# Patient Record
Sex: Female | Born: 1935 | Race: White | Hispanic: No | Smoking: Former smoker
Health system: Southern US, Community
[De-identification: ages and names within clinical notes are randomized; demographics above are authoritative.]

## PROBLEM LIST (undated history)

## (undated) DIAGNOSIS — I10 Essential (primary) hypertension: Secondary | ICD-10-CM

## (undated) DIAGNOSIS — I4891 Unspecified atrial fibrillation: Secondary | ICD-10-CM

## (undated) DIAGNOSIS — I509 Heart failure, unspecified: Secondary | ICD-10-CM

## (undated) DIAGNOSIS — E785 Hyperlipidemia, unspecified: Secondary | ICD-10-CM

## (undated) HISTORY — PX: TONSILLECTOMY: SUR1361

---

## 1999-08-14 ENCOUNTER — Encounter: Payer: Self-pay | Admitting: Emergency Medicine

## 1999-08-14 ENCOUNTER — Emergency Department (HOSPITAL_COMMUNITY): Admission: EM | Admit: 1999-08-14 | Discharge: 1999-08-14 | Payer: Self-pay | Admitting: Emergency Medicine

## 1999-12-03 ENCOUNTER — Encounter: Payer: Self-pay | Admitting: Internal Medicine

## 1999-12-03 ENCOUNTER — Encounter: Admission: RE | Admit: 1999-12-03 | Discharge: 1999-12-03 | Payer: Self-pay | Admitting: Internal Medicine

## 2016-06-13 ENCOUNTER — Emergency Department: Payer: Medicare Other

## 2016-06-13 ENCOUNTER — Encounter: Payer: Self-pay | Admitting: Emergency Medicine

## 2016-06-13 ENCOUNTER — Observation Stay
Admission: EM | Admit: 2016-06-13 | Discharge: 2016-06-13 | Disposition: A | Discharge status: Home / Self Care | Payer: Medicare Other | Attending: Internal Medicine | Admitting: Internal Medicine

## 2016-06-13 ENCOUNTER — Observation Stay
Admit: 2016-06-13 | Discharge: 2016-06-13 | Disposition: A | Discharge status: Home / Self Care | Payer: Medicare Other | Attending: Internal Medicine | Admitting: Internal Medicine

## 2016-06-13 DIAGNOSIS — R2243 Localized swelling, mass and lump, lower limb, bilateral: Secondary | ICD-10-CM | POA: Diagnosis not present

## 2016-06-13 DIAGNOSIS — I482 Chronic atrial fibrillation: Secondary | ICD-10-CM | POA: Diagnosis not present

## 2016-06-13 DIAGNOSIS — I2510 Atherosclerotic heart disease of native coronary artery without angina pectoris: Secondary | ICD-10-CM | POA: Diagnosis not present

## 2016-06-13 DIAGNOSIS — E785 Hyperlipidemia, unspecified: Secondary | ICD-10-CM | POA: Insufficient documentation

## 2016-06-13 DIAGNOSIS — Z87891 Personal history of nicotine dependence: Secondary | ICD-10-CM | POA: Insufficient documentation

## 2016-06-13 DIAGNOSIS — I509 Heart failure, unspecified: Secondary | ICD-10-CM

## 2016-06-13 DIAGNOSIS — R0902 Hypoxemia: Secondary | ICD-10-CM

## 2016-06-13 DIAGNOSIS — Z79899 Other long term (current) drug therapy: Secondary | ICD-10-CM | POA: Insufficient documentation

## 2016-06-13 DIAGNOSIS — I110 Hypertensive heart disease with heart failure: Principal | ICD-10-CM | POA: Insufficient documentation

## 2016-06-13 DIAGNOSIS — R0602 Shortness of breath: Secondary | ICD-10-CM | POA: Diagnosis present

## 2016-06-13 DIAGNOSIS — I5033 Acute on chronic diastolic (congestive) heart failure: Secondary | ICD-10-CM | POA: Diagnosis not present

## 2016-06-13 DIAGNOSIS — I083 Combined rheumatic disorders of mitral, aortic and tricuspid valves: Secondary | ICD-10-CM | POA: Diagnosis not present

## 2016-06-13 DIAGNOSIS — Z7901 Long term (current) use of anticoagulants: Secondary | ICD-10-CM | POA: Insufficient documentation

## 2016-06-13 DIAGNOSIS — R0603 Acute respiratory distress: Secondary | ICD-10-CM

## 2016-06-13 HISTORY — DX: Unspecified atrial fibrillation: I48.91

## 2016-06-13 HISTORY — DX: Heart failure, unspecified: I50.9

## 2016-06-13 HISTORY — DX: Hyperlipidemia, unspecified: E78.5

## 2016-06-13 HISTORY — DX: Essential (primary) hypertension: I10

## 2016-06-13 LAB — COMPREHENSIVE METABOLIC PANEL
ALT: 15 U/L (ref 14–54)
AST: 24 U/L (ref 15–41)
Albumin: 3.3 g/dL — ABNORMAL LOW (ref 3.5–5.0)
Alkaline Phosphatase: 43 U/L (ref 38–126)
Anion gap: 7 (ref 5–15)
BUN: 23 mg/dL — ABNORMAL HIGH (ref 6–20)
CO2: 28 mmol/L (ref 22–32)
Calcium: 10.4 mg/dL — ABNORMAL HIGH (ref 8.9–10.3)
Chloride: 105 mmol/L (ref 101–111)
Creatinine, Ser: 1.17 mg/dL — ABNORMAL HIGH (ref 0.44–1.00)
GFR calc Af Amer: 49 mL/min — ABNORMAL LOW (ref >60)
GFR calc non Af Amer: 42 mL/min — ABNORMAL LOW (ref >60)
Glucose, Bld: 109 mg/dL — ABNORMAL HIGH (ref 65–99)
Potassium: 3.8 mmol/L (ref 3.5–5.1)
Sodium: 140 mmol/L (ref 135–145)
Total Bilirubin: 0.5 mg/dL (ref 0.3–1.2)
Total Protein: 6.5 g/dL (ref 6.5–8.1)

## 2016-06-13 LAB — CBC WITH DIFFERENTIAL/PLATELET
Basophils Absolute: 0.1 10*3/uL (ref 0–0.1)
Basophils Relative: 1 %
Eosinophils Absolute: 0.3 10*3/uL (ref 0–0.7)
Eosinophils Relative: 4 %
HCT: 28.3 % — ABNORMAL LOW (ref 35.0–47.0)
Hemoglobin: 9 g/dL — ABNORMAL LOW (ref 12.0–16.0)
Lymphocytes Relative: 18 %
Lymphs Abs: 1.3 10*3/uL (ref 1.0–3.6)
MCH: 25.3 pg — ABNORMAL LOW (ref 26.0–34.0)
MCHC: 31.6 g/dL — ABNORMAL LOW (ref 32.0–36.0)
MCV: 80.2 fL (ref 80.0–100.0)
Monocytes Absolute: 0.7 10*3/uL (ref 0.2–0.9)
Monocytes Relative: 9 %
Neutro Abs: 5.1 10*3/uL (ref 1.4–6.5)
Neutrophils Relative %: 68 %
Platelets: 292 10*3/uL (ref 150–440)
RBC: 3.54 MIL/uL — ABNORMAL LOW (ref 3.80–5.20)
RDW: 17.2 % — ABNORMAL HIGH (ref 11.5–14.5)
WBC: 7.4 10*3/uL (ref 3.6–11.0)

## 2016-06-13 LAB — ECHOCARDIOGRAM COMPLETE
Height: 66 in
Weight: 2249.6 oz

## 2016-06-13 LAB — MRSA PCR SCREENING: MRSA by PCR: NEGATIVE

## 2016-06-13 LAB — TROPONIN I
Troponin I: <0.03 ng/mL (ref <0.03)
Troponin I: 0.04 ng/mL (ref <0.03)
Troponin I: 0.04 ng/mL (ref <0.03)

## 2016-06-13 LAB — BRAIN NATRIURETIC PEPTIDE: B Natriuretic Peptide: 485 pg/mL — ABNORMAL HIGH (ref 0.0–100.0)

## 2016-06-13 LAB — TSH: TSH: 3.231 u[IU]/mL (ref 0.350–4.500)

## 2016-06-13 MED ORDER — VITAMIN B-12 1000 MCG PO TABS
1000.0000 ug | ORAL_TABLET | Freq: Every day | ORAL | Status: DC
  Filled 2016-06-13: qty 1

## 2016-06-13 MED ORDER — COD LIVER OIL 1000 MG PO CAPS: 2000.0000 mg | ORAL_CAPSULE | Freq: Every day | ORAL | Status: DC

## 2016-06-13 MED ORDER — FUROSEMIDE 20 MG PO TABS: 20.0000 mg | ORAL_TABLET | ORAL | Status: DC

## 2016-06-13 MED ORDER — DOCUSATE SODIUM 100 MG PO CAPS
100.0000 mg | ORAL_CAPSULE | Freq: Two times a day (BID) | ORAL | Status: DC
  Filled 2016-06-13: qty 1

## 2016-06-13 MED ORDER — POTASSIUM CHLORIDE CRYS ER 20 MEQ PO TBCR
20.0000 meq | EXTENDED_RELEASE_TABLET | Freq: Two times a day (BID) | ORAL | Status: DC
  Filled 2016-06-13: qty 1

## 2016-06-13 MED ORDER — FUROSEMIDE 10 MG/ML IJ SOLN: 20.0000 mg | INTRAMUSCULAR | Status: DC

## 2016-06-13 MED ORDER — OMEGA-3-ACID ETHYL ESTERS 1 G PO CAPS
2.0000 g | ORAL_CAPSULE | Freq: Every day | ORAL | Status: DC
  Filled 2016-06-13: qty 2

## 2016-06-13 MED ORDER — CARVEDILOL 3.125 MG PO TABS
3.1250 mg | ORAL_TABLET | Freq: Two times a day (BID) | ORAL | Status: DC
  Filled 2016-06-13: qty 1

## 2016-06-13 MED ORDER — AMIODARONE HCL 200 MG PO TABS
200.0000 mg | ORAL_TABLET | Freq: Every day | ORAL | Status: DC
  Filled 2016-06-13: qty 1

## 2016-06-13 MED ORDER — VITAMIN D 1000 UNITS PO TABS
1000.0000 [IU] | ORAL_TABLET | Freq: Every day | ORAL | Status: DC
  Filled 2016-06-13: qty 1

## 2016-06-13 MED ORDER — FUROSEMIDE 10 MG/ML IJ SOLN
40.0000 mg | Freq: Once | INTRAMUSCULAR | Status: AC
  Administered 2016-06-13: 40 mg via INTRAVENOUS
  Filled 2016-06-13: qty 4

## 2016-06-13 MED ORDER — ACETAMINOPHEN 325 MG PO TABS: 650.0000 mg | ORAL_TABLET | Freq: Four times a day (QID) | ORAL | Status: DC | PRN

## 2016-06-13 MED ORDER — CO Q-10 200 MG PO CAPS: 1.0000 | ORAL_CAPSULE | Freq: Every day | ORAL | Status: DC

## 2016-06-13 MED ORDER — ONDANSETRON HCL 4 MG PO TABS: 4.0000 mg | ORAL_TABLET | Freq: Four times a day (QID) | ORAL | Status: DC | PRN

## 2016-06-13 MED ORDER — SODIUM CHLORIDE 0.9% FLUSH: 3.0000 mL | Freq: Two times a day (BID) | INTRAVENOUS | Status: DC

## 2016-06-13 MED ORDER — HYDROCHLOROTHIAZIDE 25 MG PO TABS
25.0000 mg | ORAL_TABLET | Freq: Every day | ORAL | Status: DC
  Filled 2016-06-13: qty 1

## 2016-06-13 MED ORDER — FUROSEMIDE 20 MG PO TABS: 20.0000 mg | ORAL_TABLET | ORAL | 0 refills | Status: AC

## 2016-06-13 MED ORDER — FUROSEMIDE 10 MG/ML IJ SOLN
20.0000 mg | Freq: Once | INTRAMUSCULAR | Status: AC
  Administered 2016-06-13: 20 mg via INTRAVENOUS
  Filled 2016-06-13: qty 4

## 2016-06-13 MED ORDER — APIXABAN 2.5 MG PO TABS
2.5000 mg | ORAL_TABLET | Freq: Two times a day (BID) | ORAL | Status: DC
  Filled 2016-06-13: qty 1

## 2016-06-13 MED ORDER — ONDANSETRON HCL 4 MG/2ML IJ SOLN: 4.0000 mg | Freq: Four times a day (QID) | INTRAMUSCULAR | Status: DC | PRN

## 2016-06-13 MED ORDER — ACETAMINOPHEN 650 MG RE SUPP: 650.0000 mg | Freq: Four times a day (QID) | RECTAL | Status: DC | PRN

## 2016-06-13 MED ORDER — IRBESARTAN 150 MG PO TABS
150.0000 mg | ORAL_TABLET | Freq: Every day | ORAL | Status: DC
  Filled 2016-06-13: qty 1

## 2016-06-13 NOTE — H&P (Signed)
Kristin Ayers is an 82 y.o. female.   Chief Complaint: Shortness of breath HPI: The patient with past medical history of atrial fibrillation and congestive heart failure presents to the emergency department complaining of shortness of breath. The patient states that she became acutely dyspneic while laying in bed. She usually does not complain of orthopnea but tonight felt as if she could not catch her breath while laying supine. She also complained of central epigastric/substernal chest pain that did not radiate. She denies associated nausea, vomiting or diaphoresis. In the emergency department the patient received Lasix IV and was started on BiPAP from which she was quickly weaned to 2 L of oxygen via nasal cannula. She had good urine output and felt remarkably better. She admits to rapid onset edema of her lower extremities that started today. Once the patient was stabilized emergency department staff called the hospitalist service for admission.  Past Medical History:  Diagnosis Date  . Atrial fibrillation ([place])   . CHF (congestive heart failure) ([place])   . Hyperlipidemia   . Hypertension     Past Surgical History:  Procedure Laterality Date  . TONSILLECTOMY      Family History  Problem Relation Age of Onset  . CAD Mother   . Stroke Father   . Breast cancer Sister    Social History:  reports that she quit smoking about 38 years ago. She has never used smokeless tobacco. She reports that she does not drink alcohol or use drugs.  Allergies:  Allergies  Allergen Reactions  . Diltiazem Other (See Comments)    "gave me heart failure"  . Amlodipine Other (See Comments)    Dizziness   . Codeine Nausea Only  . Hydralazine Other (See Comments)    Shaking all over   . Aldomet [Methyldopa] Rash    Medications Prior to Admission  Medication Sig Dispense Refill  . amiodarone (PACERONE) 200 MG tablet Take 200 mg by mouth daily.    Marland Kitchen apixaban (ELIQUIS) 2.5 MG TABS tablet Take 2.5 mg by  mouth 2 (two) times daily.    . carvedilol (COREG) 3.125 MG tablet Take 3.125 mg by mouth 2 (two) times daily with a meal.    . cholecalciferol (VITAMIN D) 1000 units tablet Take 1,000 Units by mouth daily.    Marland Kitchen Cod Liver Oil 1000 MG CAPS Take 2,000 mg by mouth daily.    . Coenzyme Q10 (CO Q-10) 200 MG CAPS Take 1 capsule by mouth daily.    . hydrochlorothiazide (HYDRODIURIL) 25 MG tablet Take 25 mg by mouth daily.    . potassium chloride SA (K-DUR,KLOR-CON) 20 MEQ tablet Take 20 mEq by mouth 2 (two) times daily.    . valsartan (DIOVAN) 160 MG tablet Take 160 mg by mouth daily.    . vitamin B-12 (CYANOCOBALAMIN) 1000 MCG tablet Take 1,000 mcg by mouth daily.      Results for orders placed or performed during the hospital encounter of 06/13/16 (from the past 48 hour(s))  CBC with Differential     Status: Abnormal   Collection Time: 06/13/16  2:13 AM  Result Value Ref Range   WBC 7.4 3.6 - 11.0 K/uL   RBC 3.54 (L) 3.80 - 5.20 MIL/uL   Hemoglobin 9.0 (L) 12.0 - 16.0 g/dL   HCT 28.3 (L) 35.0 - 47.0 %   MCV 80.2 80.0 - 100.0 fL   MCH 25.3 (L) 26.0 - 34.0 pg   MCHC 31.6 (L) 32.0 - 36.0 g/dL   RDW  17.2 (H) 11.5 - 14.5 %   Platelets 292 150 - 440 K/uL   Neutrophils Relative % 68 %   Neutro Abs 5.1 1.4 - 6.5 K/uL   Lymphocytes Relative 18 %   Lymphs Abs 1.3 1.0 - 3.6 K/uL   Monocytes Relative 9 %   Monocytes Absolute 0.7 0.2 - 0.9 K/uL   Eosinophils Relative 4 %   Eosinophils Absolute 0.3 0 - 0.7 K/uL   Basophils Relative 1 %   Basophils Absolute 0.1 0 - 0.1 K/uL  Comprehensive metabolic panel     Status: Abnormal   Collection Time: 06/13/16  2:13 AM  Result Value Ref Range   Sodium 140 135 - 145 mmol/L   Potassium 3.8 3.5 - 5.1 mmol/L   Chloride 105 101 - 111 mmol/L   CO2 28 22 - 32 mmol/L   Glucose, Bld 109 (H) 65 - 99 mg/dL   BUN 23 (H) 6 - 20 mg/dL   Creatinine, Ser 1.17 (H) 0.44 - 1.00 mg/dL   Calcium 10.4 (H) 8.9 - 10.3 mg/dL   Total Protein 6.5 6.5 - 8.1 g/dL   Albumin 3.3  (L) 3.5 - 5.0 g/dL   AST 24 15 - 41 U/L   ALT 15 14 - 54 U/L   Alkaline Phosphatase 43 38 - 126 U/L   Total Bilirubin 0.5 0.3 - 1.2 mg/dL   GFR calc non Af Amer 42 (L) >60 mL/min   GFR calc Af Amer 49 (L) >60 mL/min    Comment: (NOTE) The eGFR has been calculated using the CKD EPI equation. This calculation has not been validated in all clinical situations. eGFR's persistently <60 mL/min signify possible Chronic Kidney Disease.    Anion gap 7 5 - 15  Brain natriuretic peptide     Status: Abnormal   Collection Time: 06/13/16  2:13 AM  Result Value Ref Range   B Natriuretic Peptide 485.0 (H) 0.0 - 100.0 pg/mL  Troponin I     Status: None   Collection Time: 06/13/16  2:13 AM  Result Value Ref Range   Troponin I <0.03 <0.03 ng/mL   Dg Chest Port 1 View  Result Date: 06/13/2016 CLINICAL DATA:  Acute onset of shortness of breath. High blood pressure and decreased O2 saturation. Initial encounter. EXAM: PORTABLE CHEST 1 VIEW COMPARISON:  None. FINDINGS: The lungs are well-aerated. Vascular congestion is noted. Increased interstitial markings raise concern for mild interstitial edema. There is no evidence of pleural effusion or pneumothorax. The cardiomediastinal silhouette is mildly enlarged. No acute osseous abnormalities are seen. Degenerative change is noted at the right glenohumeral joint, with bony remodeling. IMPRESSION: Vascular congestion and mild cardiomegaly. Increased interstitial markings raise concern for mild interstitial edema. Electronically Signed   By: Garald Balding M.D.   On: 06/13/2016 02:58    Review of Systems  Constitutional: Negative for chills and fever.  HENT: Negative for sore throat and tinnitus.   Eyes: Negative for blurred vision and redness.  Respiratory: Positive for shortness of breath. Negative for cough.   Cardiovascular: Positive for chest pain. Negative for palpitations, orthopnea and PND.  Gastrointestinal: Negative for abdominal pain, diarrhea,  nausea and vomiting.  Genitourinary: Negative for dysuria, frequency and urgency.  Musculoskeletal: Negative for joint pain and myalgias.  Skin: Negative for rash.       No lesions  Neurological: Negative for speech change, focal weakness and weakness.  Endo/Heme/Allergies: Does not bruise/bleed easily.       No temperature intolerance  Psychiatric/Behavioral:  Negative for depression and suicidal ideas.    Blood pressure (!) 127/103, pulse 70, temperature 97.9 F (36.6 C), temperature source Oral, resp. rate 18, height '5\' 6"'$  (1.676 m), weight 61.2 kg (135 lb), SpO2 100 %. Physical Exam  Constitutional: She is oriented to person, place, and time. She appears well-developed and well-nourished. No distress.  HENT:  Head: Normocephalic and atraumatic.  Mouth/Throat: Oropharynx is clear and moist.  Eyes: Conjunctivae and EOM are normal. Pupils are equal, round, and reactive to light. No scleral icterus.  Neck: Normal range of motion. Neck supple. No JVD present. No tracheal deviation present. No thyromegaly present.  Cardiovascular: Normal rate and regular rhythm.  Exam reveals no gallop and no friction rub.   Murmur heard.  Systolic murmur is present with a grade of 3/6  Respiratory: Breath sounds normal. No respiratory distress.  GI: Soft. Bowel sounds are normal. She exhibits no distension. There is no tenderness.  Genitourinary:  Genitourinary Comments: Deferred  Musculoskeletal: Normal range of motion. She exhibits edema.  Lymphadenopathy:    She has no cervical adenopathy.  Neurological: She is alert and oriented to person, place, and time. No cranial nerve deficit.  Skin: Skin is warm and dry. No rash noted. No erythema.  Psychiatric: She has a normal mood and affect. Her behavior is normal. Judgment and thought content normal.     Assessment/Plan This is an 82 year old female admitted for CHF exacerbation. 1. CHF: Likely systolic; improved. The patient was diagnosed last year  but has not established care with a cardiologist since moving to the area. I have ordered an echocardiogram to assess ejection fraction. She will receive another dose of Lasix to improve pulmonary edema and vascular congestion. Her dry weight is 135 pounds. Continue Coreg 2. Atrial fibrillation: Rate controlled; continue Eliquis and amiodarone. 3. Coronary artery disease: Chest pain resolved with diuresis. Troponin is negative. Monitor telemetry. Follow cardiac enzymes 4. Hypertension: Controlled; continue ARB. The patient may need to switch hydrochlorothiazide for Lasix. 5. Hyperlipidemia: Continue omega-3 fatty acids 6. DVT prophylaxis: Full dose anticoagulation 7. GI prophylaxis: None The patient is a full code. Time spent on admission orders and patient care approximately 45 minutes  Harrie Foreman, MD 06/13/2016, 6:36 AM

## 2016-06-13 NOTE — ED Notes (Addendum)
Patient taken off BiPAP and put on 2L nasal cannula, satting at 97%, NAD.

## 2016-06-13 NOTE — Evaluation (Signed)
Physical Therapy Evaluation Patient Details Name: Kristin EvesJoanne Ayers MRN: [id number] DOB: 07/31/1930 Today's Date: 06/13/2016   History of Present Illness  82 yo female with onset of CHF and chest pain is cleared to walk with PT and see about going home.  PMHx:  HLD, a-fib, CAD, HTN,   Clinical Impression  Pt has agreed to HHPT to follow up at home, but is in need of assistance to use a lesser walking device until PT screen.  Has a fall back plan to get RW pre-dc if the help cannot be arranged.  Continue acutely if needed pending [facility].    Follow Up Recommendations Home health PT;Supervision for mobility/OOB (needs a RW if walking help not available)    Equipment Recommendations  Rolling walker with 5" wheels (if no walking help is there)    Recommendations for Other Services       Precautions / Restrictions Precautions Precautions: Fall (telemetry) Precaution Comments: needs assistance with RW until PT can clear her for [place]ator at [place] [facility] Restrictions Weight Bearing Restrictions: No      Mobility  Bed Mobility Overal bed mobility: Needs Assistance Bed Mobility: Supine to Sit;Sit to Supine     Supine to sit: Min assist (under trunk) Sit to supine: Supervision;Min guard      Transfers Overall transfer level: Needs assistance Equipment used: Rolling walker (2 wheeled);1 person hand held assist Transfers: Sit to/from [employer]Stand;Stand Pivot Transfers Sit to Stand: Min guard;Min assist Stand pivot transfers: Min guard;Min assist       General transfer comment: cues for direction of walker and with HHA before using walker  Ambulation/Gait Ambulation/Gait assistance: Min guard;Min assist Ambulation Distance (Feet): 400 Feet (300+100) Assistive device: Rolling walker (2 wheeled);1 person hand held assist Gait Pattern/deviations: Step-through pattern;Step-to pattern;Wide base of support;Trunk flexed;Narrow base of support;Decreased stride length Gait  velocity: reduced Gait velocity interpretation: Below normal speed for age/gender    Stairs            Wheelchair Mobility    Modified Rankin (Stroke Patients Only)       Balance Overall balance assessment: Needs assistance Sitting-balance support: Feet supported Sitting balance-Leahy Scale: Good   Postural control: Posterior lean Standing balance support: Bilateral upper extremity supported;Single extremity supported Standing balance-Leahy Scale: Fair                               Pertinent Vitals/Pain Pain Assessment: No/denies pain    Home Living Family/patient expects to be discharged to:: Other (Comment)                 Additional Comments: independent living    Prior Function Level of Independence: Independent         Comments: owns a rollator     Hand Dominance        Extremity/Trunk Assessment   Upper Extremity Assessment: Overall WFL for tasks assessed           Lower Extremity Assessment: Generalized weakness      Cervical / Trunk Assessment: Kyphotic  Communication   Communication: No difficulties  Cognition Arousal/Alertness: Awake/alert Behavior During Therapy: WFL for tasks assessed/performed Overall Cognitive Status: Within Functional Limits for tasks assessed                      General Comments      Exercises     Assessment/Plan    PT Assessment Patient  needs continued PT services  PT Problem List Decreased strength;Decreased range of motion;Decreased activity tolerance;Decreased balance;Decreased mobility;Decreased coordination;Decreased knowledge of use of DME;Decreased safety awareness;Cardiopulmonary status limiting activity          PT Treatment Interventions DME instruction;Gait training;Functional mobility training;Therapeutic activities;Therapeutic exercise;Balance training;Neuromuscular re-education;Patient/family education    PT Goals (Current goals can be found in the Care  Plan section)  Acute Rehab PT Goals Patient Stated Goal: to be able to handle long hallways at home PT Goal Formulation: With patient/family Time For Goal Achievement: 06/27/16 Potential to Achieve Goals: Good    Frequency Min 2X/week   Barriers to discharge Inaccessible home environment;Decreased caregiver support ([place] with long hallways and owns a rollator) may need RW    Co-evaluation               End of Session Equipment Utilized During Treatment: Gait belt Activity Tolerance: Patient tolerated treatment well;Patient limited by fatigue Patient left: in bed;with call bell/phone within reach;with family/visitor present Nurse Communication: Mobility status    Functional Assessment Tool Used: clinical judgment Functional Limitation: Mobility: Walking and moving around Mobility: Walking and Moving Around Current Status ([id number](G8978): At least 20 percent but less than 40 percent impaired, limited or restricted Mobility: Walking and Moving Around Goal Status [phone]([id number]): At least 1 percent but less than 20 percent impaired, limited or restricted    Time: 1255-1312 PT Time Calculation (min) (ACUTE ONLY): 17 min   Charges:   PT Evaluation $PT Eval Moderate Complexity: 1 Procedure PT Treatments $Gait Training: 8-22 mins   PT G Codes:   PT G-Codes **NOT FOR INPATIENT CLASS** Functional Assessment Tool Used: clinical judgment Functional Limitation: Mobility: Walking and moving around Mobility: Walking and Moving Around Current Status ([id number](G8978): At least 20 percent but less than 40 percent impaired, limited or restricted Mobility: Walking and Moving Around Goal Status [phone](G8979): At least 1 percent but less than 20 percent impaired, limited or restricted    Ivar DrapeStout, Zayn Selley E 06/13/2016, 2:30 PM    Samul Dadauth Dacey Milberger, PT MS Acute Rehab Dept. Number: [facility] [phone] and [facility] [phone]

## 2016-06-13 NOTE — Care Management Obs Status (Signed)
MEDICARE OBSERVATION STATUS NOTIFICATION   Patient Details  Name: Kristin Ayers MRN: [id number] Date of Birth: 06/29/1931   Medicare Observation Status Notification Given:  No (Discharge order placed less than 24 hours)    Caren MacadamMichelle Sabrea Sankey, RN 06/13/2016, 2:03 PM

## 2016-06-13 NOTE — Progress Notes (Signed)
Kristin Ayers is a 82 y.o. female  [id number]  Primary Cardiologist: Adrian BlackwaterShaukat Rica Heather Reason for Consultation: Chest pain and shortness of breath  HPI: This is a 82 year old white female with a past medical history of atrial fibrillation CHF presented to the hospital with severe shortness of breath orthopnea PND and leg swelling. She also had chest pain.   Review of Systems: Patient did have chest pain retrosternal area associated with shortness of breath orthopnea PND and leg swelling   Past Medical History:  Diagnosis Date  . Atrial fibrillation ([facility])   . CHF (congestive heart failure) ([facility])   . Hyperlipidemia   . Hypertension     Medications Prior to Admission  Medication Sig Dispense Refill  . amiodarone (PACERONE) 200 MG tablet Take 200 mg by mouth daily.    Marland Kitchen. apixaban (ELIQUIS) 2.5 MG TABS tablet Take 2.5 mg by mouth 2 (two) times daily.    . carvedilol (COREG) 3.125 MG tablet Take 3.125 mg by mouth 2 (two) times daily with a meal.    . cholecalciferol (VITAMIN D) 1000 units tablet Take 1,000 Units by mouth daily.    Marland Kitchen. Cod Liver Oil 1000 MG CAPS Take 2,000 mg by mouth daily.    . Coenzyme Q10 (CO Q-10) 200 MG CAPS Take 1 capsule by mouth daily.    . hydrochlorothiazide (HYDRODIURIL) 25 MG tablet Take 25 mg by mouth daily.    . potassium chloride SA (K-DUR,KLOR-CON) 20 MEQ tablet Take 20 mEq by mouth 2 (two) times daily.    . valsartan (DIOVAN) 160 MG tablet Take 160 mg by mouth daily.    . vitamin B-12 (CYANOCOBALAMIN) 1000 MCG tablet Take 1,000 mcg by mouth daily.       Marland Kitchen. amiodarone  200 mg Oral Daily  . apixaban  2.5 mg Oral BID  . carvedilol  3.125 mg Oral BID WC  . cholecalciferol  1,000 Units Oral Daily  . docusate sodium  100 mg Oral BID  . hydrochlorothiazide  25 mg Oral Daily  . irbesartan  150 mg Oral Daily  . omega-3 acid ethyl esters  2 g Oral Daily  . potassium chloride SA  20 mEq Oral BID  . sodium chloride flush  3 mL Intravenous Q12H  . vitamin B-12   1,000 mcg Oral Daily    Infusions:   Allergies  Allergen Reactions  . Diltiazem Other (See Comments)    "gave me heart failure"  . Amlodipine Other (See Comments)    Dizziness   . Codeine Nausea Only  . Hydralazine Other (See Comments)    Shaking all over   . Aldomet [Methyldopa] Rash    Social History   Social History  . Marital status: Widowed    Spouse name: N/A  . Number of children: N/A  . Years of education: N/A   Occupational History  . Not on file.   Social History Main Topics  . Smoking status: Former Smoker    Quit date: 1979  . Smokeless tobacco: Never Used  . Alcohol use No  . Drug use: No  . Sexual activity: Not on file   Other Topics Concern  . Not on file   Social History Narrative  . No narrative on file    Family History  Problem Relation Age of Onset  . CAD Mother   . Stroke Father   . Breast cancer Sister     PHYSICAL EXAM: Vitals:   06/13/16 0500 06/13/16 0541  BP: (!) 171/76 Marland Kitchen(!)  127/103  Pulse: 68 70  Resp: 15 18  Temp:  97.9 F (36.6 C)     Intake/Output Summary (Last 24 hours) at 06/13/16 1000 Last data filed at 06/13/16 0953  Gross per 24 hour  Intake              240 ml  Output              600 ml  Net             -360 ml    General:  Well appearing. No respiratory difficulty HEENT: normal Neck: supple. no JVD. Carotids 2+ bilat; no bruits. No lymphadenopathy or thryomegaly appreciated. Cor: PMI nondisplaced. Regular rate & rhythm. No rubs, gallops or murmurs. Lungs: clear Abdomen: soft, nontender, nondistended. No hepatosplenomegaly. No bruits or masses. Good bowel sounds. Extremities: no cyanosis, clubbing, rash, edema Neuro: alert & oriented x 3, cranial nerves grossly intact. moves all 4 extremities w/o difficulty. Affect pleasant.  ECG:EKG is not in the chart  Results for orders placed or performed during the hospital encounter of 06/13/16 (from the past 24 hour(s))  CBC with Differential     Status:  Abnormal   Collection Time: 06/13/16  2:13 AM  Result Value Ref Range   WBC 7.4 3.6 - 11.0 K/uL   RBC 3.54 (L) 3.80 - 5.20 MIL/uL   Hemoglobin 9.0 (L) 12.0 - 16.0 g/dL   HCT 19.1 (L) 47.8 - 29.5 %   MCV 80.2 80.0 - 100.0 fL   MCH 25.3 (L) 26.0 - 34.0 pg   MCHC 31.6 (L) 32.0 - 36.0 g/dL   RDW 62.1 (H) 30.8 - 65.7 %   Platelets 292 150 - 440 K/uL   Neutrophils Relative % 68 %   Neutro Abs 5.1 1.4 - 6.5 K/uL   Lymphocytes Relative 18 %   Lymphs Abs 1.3 1.0 - 3.6 K/uL   Monocytes Relative 9 %   Monocytes Absolute 0.7 0.2 - 0.9 K/uL   Eosinophils Relative 4 %   Eosinophils Absolute 0.3 0 - 0.7 K/uL   Basophils Relative 1 %   Basophils Absolute 0.1 0 - 0.1 K/uL  Comprehensive metabolic panel     Status: Abnormal   Collection Time: 06/13/16  2:13 AM  Result Value Ref Range   Sodium 140 135 - 145 mmol/L   Potassium 3.8 3.5 - 5.1 mmol/L   Chloride 105 101 - 111 mmol/L   CO2 28 22 - 32 mmol/L   Glucose, Bld 109 (H) 65 - 99 mg/dL   BUN 23 (H) 6 - 20 mg/dL   Creatinine, Ser 8.46 (H) 0.44 - 1.00 mg/dL   Calcium 96.2 (H) 8.9 - 10.3 mg/dL   Total Protein 6.5 6.5 - 8.1 g/dL   Albumin 3.3 (L) 3.5 - 5.0 g/dL   AST 24 15 - 41 U/L   ALT 15 14 - 54 U/L   Alkaline Phosphatase 43 38 - 126 U/L   Total Bilirubin 0.5 0.3 - 1.2 mg/dL   GFR calc non Af Amer 42 (L) >60 mL/min   GFR calc Af Amer 49 (L) >60 mL/min   Anion gap 7 5 - 15  Brain natriuretic peptide     Status: Abnormal   Collection Time: 06/13/16  2:13 AM  Result Value Ref Range   B Natriuretic Peptide 485.0 (H) 0.0 - 100.0 pg/mL  Troponin I     Status: None   Collection Time: 06/13/16  2:13 AM  Result Value Ref Range  Troponin I <0.03 <0.03 ng/mL  MRSA PCR Screening     Status: None   Collection Time: 06/13/16  5:38 AM  Result Value Ref Range   MRSA by PCR NEGATIVE NEGATIVE  Troponin I (q 6hr x 3)     Status: Abnormal   Collection Time: 06/13/16  8:34 AM  Result Value Ref Range   Troponin I 0.04 (HH) <0.03 ng/mL   Dg Chest  Port 1 View  Result Date: 06/13/2016 CLINICAL DATA:  Acute onset of shortness of breath. High blood pressure and decreased O2 saturation. Initial encounter. EXAM: PORTABLE CHEST 1 VIEW COMPARISON:  None. FINDINGS: The lungs are well-aerated. Vascular congestion is noted. Increased interstitial markings raise concern for mild interstitial edema. There is no evidence of pleural effusion or pneumothorax. The cardiomediastinal silhouette is mildly enlarged. No acute osseous abnormalities are seen. Degenerative change is noted at the right glenohumeral joint, with bony remodeling. IMPRESSION: Vascular congestion and mild cardiomegaly. Increased interstitial markings raise concern for mild interstitial edema. Electronically Signed   By: Roanna RaiderJeffery  Chang M.D.   On: 06/13/2016 02:58     ASSESSMENT AND PLAN: Congestive heart failure due to diastolic dysfunction, moderate mitral regurgitation and severely dilated left atrium. Patient denies any chest pain at this time but there is no EKG in the chart and will do EKG. Also agree with current treatment.  Verlinda Slotnick A

## 2016-06-13 NOTE — Care Management Note (Addendum)
Case Management Note  Patient Details  Name: Kristin Ayers MRN: [id number] Date of Birth: 02/04/1931  Subjective/Objective:      Discussed discharge planning with Dr Enedina FinnerSona Patel, Windell Mouldinguth from [facility], Kristin Ayers and her daughters Kristin Ayers and Kristin Ayers. Daughter Kristin Ayers reports that she understands that Kristin Ayers is unsafe to be left alone at this time in [facility]. Kristin Ayers reports that someone in the family will be with Kristin Ayers al all times and the family will have Kristin Ayers transferred into an Assisted Living facility as soon as possible if needed. Kristin Ayers has a rollator, and a front wheel rolling walker will be shipped to her from [facility]. A referral for HH-RN and PT was faxed to [facility]. Daughter Kristin Ayers reports that she has an appointment next Friday for Kristin Ayers [facility] [place].               Action/Plan:   Expected Discharge Date:                  Expected Discharge Plan:     In-House Referral:     Discharge planning Services     Post Acute Care Choice:    Choice offered to:     DME Arranged:    DME Agency:     HH Arranged:    HH Agency:     Status of Service:     If discussed at [employer] Length of Stay Meetings, dates discussed:    Additional Comments:  Kristin Ayers A, RN 06/13/2016, 2:48 PM

## 2016-06-13 NOTE — ED Provider Notes (Signed)
[facility] Emergency Department Provider Note   ____________________________________________   First MD Initiated Contact with Patient 06/13/16 [id number]     (approximate)  I have reviewed the triage vital signs and the nursing notes.   HISTORY  Chief Complaint Shortness of Breath    HPI Kristin Ayers is a 82 y.o. female who presents to the ED from home via EMS with a chief complaint of respiratory distress. Patient has a history of CHF who is diagnosed in June. Also history of atrial fibrillation on Eliquis. Reports she was in her usual state of health when she began to experience progressive shortness of breath approximately 12:30 AM. Upon their arrival, EMS reports patient was hypertensive, hypoxic with room air saturations 86%. She was placed on CPAP, given enalapril, nitroglycerin spray 2 and nitroglycerin paste. Denies recent fever, chills, chest pain, abdominal pain, nausea, vomiting, diarrhea. Denies recent travel or trauma. Nothing makes her symptoms better or worse.   Past medical history CHF Hypertension Hyperlipidemia Atrial fibrillation  There are no active problems to display for this patient.   Past surgical history Tonsillectomy  Prior to Admission medications   Not on File    Allergies Diltiazem  No family history on file.  Social History Social History  Substance Use Topics  . Smoking status: Former Smoker    Quit date: 1979  . Smokeless tobacco: Never Used  . Alcohol use No  Nonsmoker  Review of Systems  Constitutional: No fever/chills. Eyes: No visual changes. ENT: No sore throat. Cardiovascular: Denies chest pain. Respiratory: Positive for shortness of breath. Gastrointestinal: No abdominal pain.  No nausea, no vomiting.  No diarrhea.  No constipation. Genitourinary: Negative for dysuria. Musculoskeletal: Negative for back pain. Skin: Negative for rash. Neurological: Negative for headaches, focal weakness or  numbness.  10-point ROS otherwise negative.  ____________________________________________   PHYSICAL EXAM:  VITAL SIGNS: ED Triage Vitals [06/13/16 0143]  Enc Vitals Group     BP (!) 224/95     Pulse Rate 75     Resp (!) 22     Temp 97.9 F (36.6 C)     Temp Source Oral     SpO2 100 %     Weight      Height      Head Circumference      Peak Flow      Pain Score      Pain Loc      Pain Edu?      Excl. in GC?     Constitutional: Alert and oriented. Well appearing and in moderate acute distress. Eyes: Conjunctivae are normal. PERRL. EOMI. Head: Atraumatic. Nose: No congestion/rhinnorhea. Mouth/Throat: Mucous membranes are moist.  Oropharynx non-erythematous. Neck: No stridor.   Cardiovascular: Normal rate, regular rhythm. Grossly normal heart sounds.  Good peripheral circulation. Respiratory: Increased respiratory effort.  No retractions. Lungs with rales bilaterally. Gastrointestinal: Soft and nontender. No distention. No abdominal bruits. No CVA tenderness. Musculoskeletal: No lower extremity tenderness. 1+ BLE nonpitting edema.  No joint effusions. Neurologic:  Normal speech and language. No gross focal neurologic deficits are appreciated.  Skin:  Skin is warm, dry and intact. No rash noted. Psychiatric: Mood and affect are normal. Speech and behavior are normal.  ____________________________________________   LABS (all labs ordered are listed, but only abnormal results are displayed)  Labs Reviewed  CBC WITH DIFFERENTIAL/PLATELET - Abnormal; Notable for the following:       Result Value   RBC 3.54 (*)    Hemoglobin  9.0 (*)    HCT 28.3 (*)    MCH 25.3 (*)    MCHC 31.6 (*)    RDW 17.2 (*)    All other components within normal limits  COMPREHENSIVE METABOLIC PANEL - Abnormal; Notable for the following:    Glucose, Bld 109 (*)    BUN 23 (*)    Creatinine, Ser 1.17 (*)    Calcium 10.4 (*)    Albumin 3.3 (*)    GFR calc non Af Amer 42 (*)    GFR calc Af  Amer 49 (*)    All other components within normal limits  BRAIN NATRIURETIC PEPTIDE - Abnormal; Notable for the following:    B Natriuretic Peptide 485.0 (*)    All other components within normal limits  TROPONIN I   ____________________________________________  EKG  ED ECG REPORT I, SUNG,JADE J, the attending physician, personally viewed and interpreted this ECG.   Date: 06/13/2016  EKG Time: 0146  Rate: 74  Rhythm: normal EKG, normal sinus rhythm  Axis: Normal  Intervals:none  ST&T Change: Nonspecific  ____________________________________________  RADIOLOGY  Portable chest x-ray (viewed by me, interpreted per Dr. Cherly Hensenhang):  Vascular congestion and mild cardiomegaly. Increased interstitial  markings raise concern for mild interstitial edema.   ____________________________________________   PROCEDURES  Procedure(s) performed: None  Procedures  Critical Care performed: Yes, see critical care note(s)  CRITICAL CARE Performed by: Irean HongSUNG,JADE J   Total critical care time: 30 minutes  Critical care time was exclusive of separately billable procedures and treating other patients.  Critical care was necessary to treat or prevent imminent or life-threatening deterioration.  Critical care was time spent personally by me on the following activities: development of treatment plan with patient and/or surrogate as well as nursing, discussions with consultants, evaluation of patient's response to treatment, examination of patient, obtaining history from patient or surrogate, ordering and performing treatments and interventions, ordering and review of laboratory studies, ordering and review of radiographic studies, pulse oximetry and re-evaluation of patient's condition. ____________________________________________   INITIAL IMPRESSION / ASSESSMENT AND PLAN / ED COURSE  Pertinent labs & imaging results that were available during my care of the patient were reviewed by me and  considered in my medical decision making (see chart for details).  82 year old female with CHF and atrial fibrillation who presents in moderate respiratory distress. Symptoms clinically consistent with CHF exacerbation. Patient was placed on BiPAP immediately upon her arrival to the emergency department and already is feeling better with saturations 100%.  Clinical Course as of Jun 14 323  Sat Jun 13, 2016  [id number] Patient was successfully weaned off BiPAP and is currently on 2 L nasal cannula with saturations 98%. Updated her of laboratory and imaging results. She is starting to have urine output from the Lasix. Discuss with hospitalist to evaluate patient in the emergency department for admission.  [JS]    Clinical Course User Index [JS] Irean HongJade J Sung, MD     ____________________________________________   FINAL CLINICAL IMPRESSION(S) / ED DIAGNOSES  Final diagnoses:  Hypoxia  SOB (shortness of breath)  Acute on chronic congestive heart failure, unspecified congestive heart failure type (HCC)  Acute respiratory distress      NEW MEDICATIONS STARTED DURING THIS VISIT:  New Prescriptions   No medications on file     Note:  This document was prepared using Dragon voice recognition software and may include unintentional dictation errors.    Irean HongJade J Sung, MD 06/13/16 [phone]

## 2016-06-13 NOTE — Progress Notes (Signed)
*  PRELIMINARY RESULTS* Echocardiogram 2D Echocardiogram has been performed.  Kristin Ayers 06/13/2016, 7:59 AM

## 2016-06-13 NOTE — Progress Notes (Signed)
Pt arrived from [facility] [place] and oriented. Telemetry box verified with Nt. Second RN verified skin assessment. No c/o pain, no behaviors. Pt oriented to staff and equipment. No concerns offered.

## 2016-06-13 NOTE — Progress Notes (Signed)
PHARMACIST - PHYSICIAN ORDER COMMUNICATION  CONCERNING: P&T Medication Policy on Herbal Medications  DESCRIPTION:  This patient's order for:  Co-Q-10  has been noted.  This product(s) is classified as an "herbal" or natural product. Due to a lack of definitive safety studies or FDA approval, nonstandard manufacturing practices, plus the potential risk of unknown drug-drug interactions while on inpatient medications, the Pharmacy and Therapeutics Committee does not permit the use of "herbal" or natural products of this type within [place].   ACTION TAKEN: The pharmacy department is unable to verify this order at this time. Please reevaluate patient's clinical condition at discharge and address if the herbal or natural product(s) should be resumed at that time.   

## 2016-06-13 NOTE — ED Triage Notes (Signed)
Pt called for SOB which had started around 0030 and called 911 a litte bit after that time. VS with EMS was 220/110 BP, O2 86% RA, and upon giving her CPAP O2 95%. EMS administered Enalapril 1.25, 2 sprays of Nitro, and 1 1/2 in Nitro paste. EMS reports that pt was diagnosed CHF in June. EMS reports that lung sound are diminished with Rhonchi. Pt is calm at this time and respiratory at bedside for O2 maintenance.

## 2016-06-13 NOTE — Progress Notes (Signed)
Pt t o be discharged back to [facility] today. Home health has been arranged by care managementl.iv and tele have been removed. disch isntructions aand prescrips given to pt and family. disch via w.c. / family to transport.

## 2016-06-13 NOTE — Discharge Summary (Signed)
[facility] [place] [facility]   PATIENT NAME: Toyna Erisman    MR#:  [id number]  DATE OF BIRTH:  1931/02/12  DATE OF ADMISSION:  06/13/2016 ADMITTING PHYSICIAN: Arnaldo Natal, MD  DATE OF DISCHARGE: 06/13/16  PRIMARY CARE PHYSICIAN: No PCP Per Patient    ADMISSION DIAGNOSIS:  Acute respiratory distress [R06.03] SOB (shortness of breath) [R06.02] Hypoxia [R09.02] Acute on chronic congestive heart failure, unspecified congestive heart failure type (HCC) [I50.9]  DISCHARGE DIAGNOSIS:  Acute on chronic mild diastolic HF-improved Hypoxia resolved Chronic afib on po anticoagulation SECONDARY DIAGNOSIS:   Past Medical History:  Diagnosis Date  . Atrial fibrillation (HCC)   . CHF (congestive heart failure) (HCC)   . Hyperlipidemia   . Hypertension     HOSPITAL COURSE:   82 year old female admitted for CHF exacerbation. 1. CHF: Diastolic acute on chronic improved.  -The patient was diagnosed last year but has not established care with a cardiologist since moving to the area. -She will receive another dose of Lasix to improve pulmonary edema and vascular congestion.  - Continue Coreg -sats 94% on RA -uop ~600 cc feels good -seen by Dr Welton Flakes and ok with lasix qod. F/u next week -echo reviewd  2. Atrial fibrillation: Rate controlled; continue Eliquis and amiodarone.  3. Coronary artery disease: Chest pain resolved with diuresis. Troponin is negative.  -no cp  4. Hypertension: Controlled; continue ARB.d/ced HCTZ since pt is on lasix  5. Hyperlipidemia: Continue omega-3 fatty acids  6. DVT prophylaxis: Full dose anticoagulation  Spoke with dter Merilyn Baba on the phone  [facility] for CHF to be arranged. If need PT also  D/c home CONSULTS OBTAINED:  Treatment Team:  Laurier Nancy, MD  DRUG ALLERGIES:   Allergies  Allergen Reactions  . Diltiazem Other (See Comments)    "gave me heart failure"  . Amlodipine Other (See Comments)   Dizziness   . Codeine Nausea Only  . Hydralazine Other (See Comments)    Shaking all over   . Aldomet [Methyldopa] Rash    DISCHARGE MEDICATIONS:   Current Discharge Medication List    START taking these medications   Details  furosemide (LASIX) 20 MG tablet Take 1 tablet (20 mg total) by mouth every other day. Qty: 30 tablet, Refills: 0      CONTINUE these medications which have NOT CHANGED   Details  amiodarone (PACERONE) 200 MG tablet Take 200 mg by mouth daily.    apixaban (ELIQUIS) 2.5 MG TABS tablet Take 2.5 mg by mouth 2 (two) times daily.    carvedilol (COREG) 3.125 MG tablet Take 3.125 mg by mouth 2 (two) times daily with a meal.    cholecalciferol (VITAMIN D) 1000 units tablet Take 1,000 Units by mouth daily.    Cod Liver Oil 1000 MG CAPS Take 2,000 mg by mouth daily.    Coenzyme Q10 (CO Q-10) 200 MG CAPS Take 1 capsule by mouth daily.    potassium chloride SA (K-DUR,KLOR-CON) 20 MEQ tablet Take 20 mEq by mouth 2 (two) times daily.    valsartan (DIOVAN) 160 MG tablet Take 160 mg by mouth daily.    vitamin B-12 (CYANOCOBALAMIN) 1000 MCG tablet Take 1,000 mcg by mouth daily.      STOP taking these medications     hydrochlorothiazide (HYDRODIURIL) 25 MG tablet         If you experience worsening of your admission symptoms, develop shortness of breath, life threatening emergency, suicidal or homicidal thoughts you  must seek medical attention immediately by calling 911 or calling your MD immediately  if symptoms less severe.  You Must read complete instructions/literature along with all the possible adverse reactions/side effects for all the Medicines you take and that have been prescribed to you. Take any new Medicines after you have completely understood and accept all the possible adverse reactions/side effects.   Please note  You were cared for by a hospitalist during your hospital stay. If you have any questions about your discharge medications or the  care you received while you were in the hospital after you are discharged, you can call the unit and asked to speak with the hospitalist on call if the hospitalist that took care of you is not available. Once you are discharged, your primary care physician will handle any further medical issues. Please note that NO REFILLS for any discharge medications will be authorized once you are discharged, as it is imperative that you return to your primary care physician (or establish a relationship with a primary care physician if you do not have one) for your aftercare needs so that they can reassess your need for medications and monitor your lab values. Today   SUBJECTIVE   Feels back to baseline  VITAL SIGNS:  Blood pressure (!) 132/46, pulse 64, temperature 98 F (36.7 C), temperature source Oral, resp. rate 14, height 5\' 6"  (1.676 m), weight 63.8 kg (140 lb 9.6 oz), SpO2 94 %.  I/O:    Intake/Output Summary (Last 24 hours) at 06/13/16 1250 Last data filed at 06/13/16 0953  Gross per 24 hour  Intake              240 ml  Output              600 ml  Net             -360 ml    PHYSICAL EXAMINATION:  GENERAL:  82 y.o.-year-old patient lying in the bed with no acute distress.  EYES: Pupils equal, round, reactive to light and accommodation. No scleral icterus. Extraocular muscles intact.  HEENT: Head atraumatic, normocephalic. Oropharynx and nasopharynx clear.  NECK:  Supple, no jugular venous distention. No thyroid enlargement, no tenderness.  LUNGS: Normal breath sounds bilaterally, no wheezing, rales,rhonchi or crepitation. No use of accessory muscles of respiration.  CARDIOVASCULAR: S1, S2 normal. No murmurs, rubs, or gallops.  ABDOMEN: Soft, non-tender, non-distended. Bowel sounds present. No organomegaly or mass.  EXTREMITIES:+ pedal edema, no cyanosis, or clubbing.  NEUROLOGIC: Cranial nerves II through XII are intact. Muscle strength 5/5 in all extremities. Sensation intact. Gait not  checked.  PSYCHIATRIC: The patient is alert and oriented x 3.  SKIN: No obvious rash, lesion, or ulcer.   DATA REVIEW:   CBC   Recent Labs Lab 06/13/16 0213  WBC 7.4  HGB 9.0*  HCT 28.3*  PLT 292    Chemistries   Recent Labs Lab 06/13/16 0213  NA 140  K 3.8  CL 105  CO2 28  GLUCOSE 109*  BUN 23*  CREATININE 1.17*  CALCIUM 10.4*  AST 24  ALT 15  ALKPHOS 43  BILITOT 0.5    Microbiology Results   Recent Results (from the past 240 hour(s))  MRSA PCR Screening     Status: None   Collection Time: 06/13/16  5:38 AM  Result Value Ref Range Status   MRSA by PCR NEGATIVE NEGATIVE Final    Comment:        The GeneXpert MRSA  Assay (FDA approved for NASAL specimens only), is one component of a comprehensive MRSA colonization surveillance program. It is not intended to diagnose MRSA infection nor to guide or monitor treatment for MRSA infections.     RADIOLOGY:  Dg Chest Port 1 View  Result Date: 06/13/2016 CLINICAL DATA:  Acute onset of shortness of breath. High blood pressure and decreased O2 saturation. Initial encounter. EXAM: PORTABLE CHEST 1 VIEW COMPARISON:  None. FINDINGS: The lungs are well-aerated. Vascular congestion is noted. Increased interstitial markings raise concern for mild interstitial edema. There is no evidence of pleural effusion or pneumothorax. The cardiomediastinal silhouette is mildly enlarged. No acute osseous abnormalities are seen. Degenerative change is noted at the right glenohumeral joint, with bony remodeling. IMPRESSION: Vascular congestion and mild cardiomegaly. Increased interstitial markings raise concern for mild interstitial edema. Electronically Signed   By: Roanna RaiderJeffery  Chang M.D.   On: 06/13/2016 02:58     Management plans discussed with the patient, family and they are in agreement.  CODE STATUS:     Code Status Orders        Start     Ordered   06/13/16 0536  Full code  Continuous     06/13/16 0535    Code Status  History    Date Active Date Inactive Code Status Order ID Comments User Context   This patient has a current code status but no historical code status.    Advance Directive Documentation   Flowsheet Row Most Recent Value  Type of Advance Directive  Living will  Pre-existing out of facility DNR order (yellow form or pink MOST form)  No data  "MOST" Form in Place?  No data      TOTAL TIME TAKING CARE OF THIS PATIENT: 40 minutes.    Gerard Cantara M.D on 06/13/2016 at 12:50 PM  Between 7am to 6pm - Pager - [phone] After 6pm go to [email or web address] - password [email or web address] [facility]  [facility]  Office  [phone]  CC: Primary care physician; No PCP Per Patient

## 2016-06-14 LAB — HEMOGLOBIN A1C
Hgb A1c MFr Bld: 5.6 % (ref 4.8–5.6)
Mean Plasma Glucose: 114 mg/dL

## 2016-06-20 ENCOUNTER — Inpatient Hospital Stay
Admission: EM | Admit: 2016-06-20 | Discharge: 2016-06-22 | DRG: 291 | Disposition: A | Discharge status: Home Health | Payer: Medicare Other | Attending: Internal Medicine | Admitting: Internal Medicine

## 2016-06-20 ENCOUNTER — Encounter: Payer: Self-pay | Admitting: Emergency Medicine

## 2016-06-20 ENCOUNTER — Emergency Department: Payer: Medicare Other

## 2016-06-20 DIAGNOSIS — R0603 Acute respiratory distress: Secondary | ICD-10-CM | POA: Diagnosis present

## 2016-06-20 DIAGNOSIS — Z87891 Personal history of nicotine dependence: Secondary | ICD-10-CM

## 2016-06-20 DIAGNOSIS — N179 Acute kidney failure, unspecified: Secondary | ICD-10-CM | POA: Diagnosis present

## 2016-06-20 DIAGNOSIS — Z9109 Other allergy status, other than to drugs and biological substances: Secondary | ICD-10-CM | POA: Diagnosis not present

## 2016-06-20 DIAGNOSIS — Z79899 Other long term (current) drug therapy: Secondary | ICD-10-CM | POA: Diagnosis not present

## 2016-06-20 DIAGNOSIS — R0602 Shortness of breath: Secondary | ICD-10-CM

## 2016-06-20 DIAGNOSIS — Z803 Family history of malignant neoplasm of breast: Secondary | ICD-10-CM

## 2016-06-20 DIAGNOSIS — I130 Hypertensive heart and chronic kidney disease with heart failure and stage 1 through stage 4 chronic kidney disease, or unspecified chronic kidney disease: Principal | ICD-10-CM | POA: Diagnosis present

## 2016-06-20 DIAGNOSIS — E860 Dehydration: Secondary | ICD-10-CM | POA: Diagnosis present

## 2016-06-20 DIAGNOSIS — I482 Chronic atrial fibrillation: Secondary | ICD-10-CM | POA: Diagnosis present

## 2016-06-20 DIAGNOSIS — I5043 Acute on chronic combined systolic (congestive) and diastolic (congestive) heart failure: Secondary | ICD-10-CM | POA: Diagnosis present

## 2016-06-20 DIAGNOSIS — Z8249 Family history of ischemic heart disease and other diseases of the circulatory system: Secondary | ICD-10-CM

## 2016-06-20 DIAGNOSIS — Z888 Allergy status to other drugs, medicaments and biological substances status: Secondary | ICD-10-CM

## 2016-06-20 DIAGNOSIS — Z955 Presence of coronary angioplasty implant and graft: Secondary | ICD-10-CM

## 2016-06-20 DIAGNOSIS — Z885 Allergy status to narcotic agent status: Secondary | ICD-10-CM

## 2016-06-20 DIAGNOSIS — Z823 Family history of stroke: Secondary | ICD-10-CM

## 2016-06-20 DIAGNOSIS — N183 Chronic kidney disease, stage 3 (moderate): Secondary | ICD-10-CM | POA: Diagnosis present

## 2016-06-20 DIAGNOSIS — I509 Heart failure, unspecified: Secondary | ICD-10-CM

## 2016-06-20 DIAGNOSIS — I5023 Acute on chronic systolic (congestive) heart failure: Secondary | ICD-10-CM

## 2016-06-20 DIAGNOSIS — Z7901 Long term (current) use of anticoagulants: Secondary | ICD-10-CM | POA: Diagnosis not present

## 2016-06-20 DIAGNOSIS — E785 Hyperlipidemia, unspecified: Secondary | ICD-10-CM | POA: Diagnosis present

## 2016-06-20 LAB — CBC
HCT: 26.2 % — ABNORMAL LOW (ref 35.0–47.0)
HEMATOCRIT: 28.2 % — AB (ref 35.0–47.0)
HEMOGLOBIN: 9.1 g/dL — AB (ref 12.0–16.0)
Hemoglobin: 8.5 g/dL — ABNORMAL LOW (ref 12.0–16.0)
MCH: 26 pg (ref 26.0–34.0)
MCH: 25.9 pg — AB (ref 26.0–34.0)
MCHC: 32.3 g/dL (ref 32.0–36.0)
MCHC: 32.3 g/dL (ref 32.0–36.0)
MCV: 80.5 fL (ref 80.0–100.0)
MCV: 80.3 fL (ref 80.0–100.0)
PLATELETS: 329 10*3/uL (ref 150–440)
PLATELETS: 269 10*3/uL (ref 150–440)
RBC: 3.5 MIL/uL — ABNORMAL LOW (ref 3.80–5.20)
RBC: 3.26 MIL/uL — ABNORMAL LOW (ref 3.80–5.20)
RDW: 17 % — AB (ref 11.5–14.5)
RDW: 16.9 % — ABNORMAL HIGH (ref 11.5–14.5)
WBC: 10.1 10*3/uL (ref 3.6–11.0)
WBC: 8 10*3/uL (ref 3.6–11.0)

## 2016-06-20 LAB — COMPREHENSIVE METABOLIC PANEL
ALBUMIN: 3.3 g/dL — AB (ref 3.5–5.0)
ALT: 22 U/L (ref 14–54)
AST: 28 U/L (ref 15–41)
Alkaline Phosphatase: 47 U/L (ref 38–126)
Anion gap: 7 (ref 5–15)
BUN: 26 mg/dL — AB (ref 6–20)
CHLORIDE: 105 mmol/L (ref 101–111)
CO2: 26 mmol/L (ref 22–32)
Calcium: 9.6 mg/dL (ref 8.9–10.3)
Creatinine, Ser: 1.47 mg/dL — ABNORMAL HIGH (ref 0.44–1.00)
GFR calc Af Amer: 37 mL/min — ABNORMAL LOW (ref >60)
GFR, EST NON AFRICAN AMERICAN: 32 mL/min — AB (ref >60)
Glucose, Bld: 191 mg/dL — ABNORMAL HIGH (ref 65–99)
POTASSIUM: 3.9 mmol/L (ref 3.5–5.1)
SODIUM: 138 mmol/L (ref 135–145)
Total Bilirubin: 0.8 mg/dL (ref 0.3–1.2)
Total Protein: 7.1 g/dL (ref 6.5–8.1)

## 2016-06-20 LAB — TROPONIN I
Troponin I: 0.03 ng/mL (ref <0.03)
Troponin I: 0.04 ng/mL (ref <0.03)
Troponin I: 0.03 ng/mL (ref <0.03)

## 2016-06-20 LAB — CREATININE, SERUM
CREATININE: 1.46 mg/dL — AB (ref 0.44–1.00)
GFR calc Af Amer: 37 mL/min — ABNORMAL LOW (ref >60)
GFR calc non Af Amer: 32 mL/min — ABNORMAL LOW (ref >60)

## 2016-06-20 LAB — BRAIN NATRIURETIC PEPTIDE: B Natriuretic Peptide: 853 pg/mL — ABNORMAL HIGH (ref 0.0–100.0)

## 2016-06-20 MED ORDER — ONDANSETRON HCL 4 MG/2ML IJ SOLN: 4.0000 mg | Freq: Four times a day (QID) | INTRAMUSCULAR | Status: DC | PRN

## 2016-06-20 MED ORDER — FUROSEMIDE 10 MG/ML IJ SOLN
40.0000 mg | Freq: Once | INTRAMUSCULAR | Status: AC
Start: 2016-06-20 — End: 2016-06-20
  Administered 2016-06-20: 40 mg via INTRAVENOUS
  Filled 2016-06-20: qty 4

## 2016-06-20 MED ORDER — CARVEDILOL 3.125 MG PO TABS
3.1250 mg | ORAL_TABLET | Freq: Two times a day (BID) | ORAL | Status: DC
  Administered 2016-06-20: 3.125 mg via ORAL
  Filled 2016-06-20: qty 1

## 2016-06-20 MED ORDER — IRBESARTAN 75 MG PO TABS
37.5000 mg | ORAL_TABLET | Freq: Every day | ORAL | Status: DC
  Administered 2016-06-20 – 2016-06-22 (×3): 37.5 mg via ORAL
  Filled 2016-06-20 (×3): qty 1

## 2016-06-20 MED ORDER — IPRATROPIUM-ALBUTEROL 0.5-2.5 (3) MG/3ML IN SOLN
3.0000 mL | Freq: Once | RESPIRATORY_TRACT | Status: AC
  Administered 2016-06-20: 3 mL via RESPIRATORY_TRACT
  Filled 2016-06-20: qty 3

## 2016-06-20 MED ORDER — AMIODARONE HCL 200 MG PO TABS
200.0000 mg | ORAL_TABLET | Freq: Every day | ORAL | Status: DC
  Administered 2016-06-20 – 2016-06-22 (×3): 200 mg via ORAL
  Filled 2016-06-20 (×3): qty 1

## 2016-06-20 MED ORDER — NITROGLYCERIN 0.4 MG SL SUBL
0.4000 mg | SUBLINGUAL_TABLET | Freq: Once | SUBLINGUAL | Status: AC
  Administered 2016-06-20: 0.4 mg via SUBLINGUAL
  Filled 2016-06-20: qty 1

## 2016-06-20 MED ORDER — SODIUM CHLORIDE 0.9% FLUSH
3.0000 mL | Freq: Two times a day (BID) | INTRAVENOUS | Status: DC
  Administered 2016-06-20 – 2016-06-21 (×4): 3 mL via INTRAVENOUS

## 2016-06-20 MED ORDER — FUROSEMIDE 10 MG/ML IJ SOLN
40.0000 mg | Freq: Two times a day (BID) | INTRAMUSCULAR | Status: DC
  Administered 2016-06-20 – 2016-06-21 (×2): 40 mg via INTRAVENOUS
  Filled 2016-06-20 (×2): qty 4

## 2016-06-20 MED ORDER — COD LIVER OIL 1000 MG PO CAPS: 2000.0000 mg | ORAL_CAPSULE | Freq: Every day | ORAL | Status: DC

## 2016-06-20 MED ORDER — VITAMIN B-12 1000 MCG PO TABS
1000.0000 ug | ORAL_TABLET | Freq: Every day | ORAL | Status: DC
  Administered 2016-06-20 – 2016-06-22 (×3): 1000 ug via ORAL
  Filled 2016-06-20 (×3): qty 1

## 2016-06-20 MED ORDER — SODIUM CHLORIDE 0.9% FLUSH: 3.0000 mL | INTRAVENOUS | Status: DC | PRN

## 2016-06-20 MED ORDER — CO Q-10 200 MG PO CAPS: 1.0000 | ORAL_CAPSULE | Freq: Every day | ORAL | Status: DC

## 2016-06-20 MED ORDER — VITAMIN D 1000 UNITS PO TABS
1000.0000 [IU] | ORAL_TABLET | Freq: Every day | ORAL | Status: DC
  Administered 2016-06-20 – 2016-06-22 (×3): 1000 [IU] via ORAL
  Filled 2016-06-20 (×3): qty 1

## 2016-06-20 MED ORDER — POTASSIUM CHLORIDE CRYS ER 20 MEQ PO TBCR
20.0000 meq | EXTENDED_RELEASE_TABLET | Freq: Two times a day (BID) | ORAL | Status: DC
  Administered 2016-06-20 – 2016-06-22 (×5): 20 meq via ORAL
  Filled 2016-06-20 (×5): qty 1

## 2016-06-20 MED ORDER — ENALAPRILAT 1.25 MG/ML IV SOLN
0.6250 mg | Freq: Once | INTRAVENOUS | Status: AC
  Administered 2016-06-20: 0.625 mg via INTRAVENOUS
  Filled 2016-06-20 (×2): qty 2

## 2016-06-20 MED ORDER — SPIRONOLACTONE 25 MG PO TABS
25.0000 mg | ORAL_TABLET | Freq: Two times a day (BID) | ORAL | Status: DC
  Administered 2016-06-20 – 2016-06-21 (×3): 25 mg via ORAL
  Filled 2016-06-20 (×3): qty 1

## 2016-06-20 MED ORDER — APIXABAN 2.5 MG PO TABS
2.5000 mg | ORAL_TABLET | Freq: Two times a day (BID) | ORAL | Status: DC
Start: 2016-06-20 — End: 2016-06-22
  Administered 2016-06-20 – 2016-06-22 (×5): 2.5 mg via ORAL
  Filled 2016-06-20 (×5): qty 1

## 2016-06-20 MED ORDER — ASPIRIN EC 81 MG PO TBEC
81.0000 mg | DELAYED_RELEASE_TABLET | Freq: Every day | ORAL | Status: DC
  Administered 2016-06-20 – 2016-06-22 (×3): 81 mg via ORAL
  Filled 2016-06-20 (×3): qty 1

## 2016-06-20 MED ORDER — CARVEDILOL 6.25 MG PO TABS
6.2500 mg | ORAL_TABLET | Freq: Two times a day (BID) | ORAL | Status: DC
  Administered 2016-06-20 – 2016-06-22 (×4): 6.25 mg via ORAL
  Filled 2016-06-20 (×4): qty 1

## 2016-06-20 MED ORDER — ACETAMINOPHEN 325 MG PO TABS: 650.0000 mg | ORAL_TABLET | ORAL | Status: DC | PRN

## 2016-06-20 MED ORDER — SODIUM CHLORIDE 0.9 % IV SOLN: 250.0000 mL | INTRAVENOUS | Status: DC | PRN

## 2016-06-20 NOTE — Care Management Note (Signed)
Case Management Note  Patient Details  Name: Cindra EvesJoanne Bacus MRN: [id number] Date of Birth: 11/07/1930  Subjective/Objective:      See Case Manager note dated 06/13/16 from Mrs Aguilera's previous admission. [facility]. A higher level of care was recommended for her prior to [facility]. Daughters reported that they were seeking placement in an Assisted Living facility at that time but that someone from the family would stay with Mrs Linward NatalCoylke until placement occurred. One daughter reported that she would be taking [name] [facility]  on 06/19/16.  CM will need to F/U with Mrs Eskin's daughters about discharge planning after this present hospitalization. [name] is currently open to [facility] for RN and PT. A RW was requested to be shipped to Mrs Bulman's home from [facility] last week. .               Action/Plan:   Expected Discharge Date:                  Expected Discharge Plan:     In-House Referral:     Discharge planning Services     Post Acute Care Choice:    Choice offered to:     DME Arranged:    DME Agency:     HH Arranged:    HH Agency:     Status of Service:     If discussed at Long Length of Stay Meetings, dates discussed:    Additional Comments:  Akyia Borelli A, RN 06/20/2016, 11:37 AM

## 2016-06-20 NOTE — ED Provider Notes (Signed)
[facility] Emergency Department Provider Note   ____________________________________________    I have reviewed the triage vital signs and the nursing notes.   HISTORY  Chief Complaint Respiratory Distress     HPI Kristin Ayers is a 82 y.o. female who presents with complaints of shortness of breath. Patient has a history of congestive heart failure, recently discharged from the hospital approximately one week ago. Saw her PCP today who apparently adjusted her medications but reports that her breathing just worsened overnight. She denies fevers or chills. She denies cough. She does not smoke but her family did smoke. Earlier she had chest discomfort that is resolved.   Past Medical History:  Diagnosis Date  . Atrial fibrillation (HCC)   . CHF (congestive heart failure) (HCC)   . Hyperlipidemia   . Hypertension     Patient Active Problem List   Diagnosis Date Noted  . CHF exacerbation (HCC) 06/13/2016    Past Surgical History:  Procedure Laterality Date  . TONSILLECTOMY      Prior to Admission medications   Medication Sig Start Date End Date Taking? Authorizing Provider  amiodarone (PACERONE) 200 MG tablet Take 200 mg by mouth daily.    Historical Provider, MD  apixaban (ELIQUIS) 2.5 MG TABS tablet Take 2.5 mg by mouth 2 (two) times daily.    Historical Provider, MD  carvedilol (COREG) 3.125 MG tablet Take 3.125 mg by mouth 2 (two) times daily with a meal.    Historical Provider, MD  cholecalciferol (VITAMIN D) 1000 units tablet Take 1,000 Units by mouth daily.    Historical Provider, MD  [facility] Liver Oil 1000 MG CAPS Take 2,000 mg by mouth daily.    Historical Provider, MD  Coenzyme Q10 (CO Q-10) 200 MG CAPS Take 1 capsule by mouth daily.    Historical Provider, MD  furosemide (LASIX) 20 MG tablet Take 1 tablet (20 mg total) by mouth every other day. 06/13/16   Enedina Finner, MD  potassium chloride SA (K-DUR,KLOR-CON) 20 MEQ tablet Take 20 mEq by  mouth 2 (two) times daily.    Historical Provider, MD  valsartan (DIOVAN) 160 MG tablet Take 160 mg by mouth daily.    Historical Provider, MD  vitamin B-12 (CYANOCOBALAMIN) 1000 MCG tablet Take 1,000 mcg by mouth daily.    Historical Provider, MD     Allergies Diltiazem; Amlodipine; Codeine; Hydralazine; and Aldomet [methyldopa]  Family History  Problem Relation Age of Onset  . CAD Mother   . Stroke Father   . Breast cancer Sister     Social History Social History  Substance Use Topics  . Smoking status: Former Smoker    Quit date: 1979  . Smokeless tobacco: Never Used  . Alcohol use No    Review of Systems  Constitutional: No fever/chills  Cardiovascular: As above Respiratory: As above Gastrointestinal: No abdominal pain. Genitourinary: Negative for dysuria. Musculoskeletal: Negative for back pain.  Neurological: Negative for headaches   10-point ROS otherwise negative.  ____________________________________________   PHYSICAL EXAM:  VITAL SIGNS: ED Triage Vitals  Enc Vitals Group     BP 06/20/16 0233 (!) 199/105     Pulse --      Resp 06/20/16 0228 (!) (P) 30     Temp --      Temp src --      SpO2 06/20/16 0228 (P) 100 %     Weight 06/20/16 0230 140 lb (63.5 kg)     Height --  Head Circumference --      Peak Flow --      Pain Score 06/20/16 0230 0     Pain Loc --      Pain Edu? --      Excl. in GC? --     Constitutional: Alert and oriented. Moderate distress Eyes: Conjunctivae are normal.   Nose: No congestion/rhinnorhea. Mouth/Throat: Mucous membranes are moist.    Cardiovascular: Normal rate, regular rhythm. Grossly normal heart sounds.  Good peripheral circulation. Respiratory: Increased respiratory effort with tachypnea, poor airflow, bibasilar rales Gastrointestinal: Soft and nontender. No distention.   Genitourinary: deferred Musculoskeletal: Mild lower extremity edema. Warm and well perfused Neurologic:  Normal speech and language.  No gross focal neurologic deficits are appreciated.  Skin:  Skin is warm, dry and intact. No rash noted.   ____________________________________________   LABS (all labs ordered are listed, but only abnormal results are displayed)  Labs Reviewed  CBC - Abnormal; Notable for the following:       Result Value   RBC 3.50 (*)    Hemoglobin 9.1 (*)    HCT 28.2 (*)    RDW 17.0 (*)    All other components within normal limits  COMPREHENSIVE METABOLIC PANEL - Abnormal; Notable for the following:    Glucose, Bld 191 (*)    BUN 26 (*)    Creatinine, Ser 1.47 (*)    Albumin 3.3 (*)    GFR calc non Af Amer 32 (*)    GFR calc Af Amer 37 (*)    All other components within normal limits  BRAIN NATRIURETIC PEPTIDE - Abnormal; Notable for the following:    B Natriuretic Peptide 853.0 (*)    All other components within normal limits  CULTURE, BLOOD (ROUTINE X 2)  CULTURE, BLOOD (ROUTINE X 2)  TROPONIN I   ____________________________________________  EKG  ED ECG REPORT I, Jene EveryKINNER, Hester Joslin, the attending physician, personally viewed and interpreted this ECG.  Date: 06/20/2016 EKG Time: 2:31 AM Rate: 78 Rhythm: normal sinus rhythm QRS Axis: normal Intervals: normal ST/T Wave abnormalities: Nonspecific Conduction Disturbances: none   ____________________________________________  RADIOLOGY  cxr c/w edema ____________________________________________   PROCEDURES  Procedure(s) performed: No    Critical Care performed: yes  CRITICAL CARE Performed by: Jene EveryKINNER, Sterlin Knightly   Total critical care time: 30 minutes  Critical care time was exclusive of separately billable procedures and treating other patients.  Critical care was necessary to treat or prevent imminent or life-threatening deterioration.  Critical care was time spent personally by me on the following activities: development of treatment plan with patient and/or surrogate as well as nursing, discussions with  consultants, evaluation of patient's response to treatment, examination of patient, obtaining history from patient or surrogate, ordering and performing treatments and interventions, ordering and review of laboratory studies, ordering and review of radiographic studies, pulse oximetry and re-evaluation of patient's condition.  ____________________________________________   INITIAL IMPRESSION / ASSESSMENT AND PLAN / ED COURSE  Pertinent labs & imaging results that were available during my care of the patient were reviewed by me and considered in my medical decision making (see chart for details).  Patient presents with moderate respiratory distress. Initially on nonrebreather, I switched this to a nasal cannula, patient appears to be tolerating this with saturations around 9495% with a heart rate of 80. She is markedly hypertensive. I've reviewed her medical records from the last visit which is consistent with pulmonary edema/acute on chronic CHF. We will give her a small dose  of enalaprilat to decrease afterload, sublingual nitroglycerin shown to decrease preload as well as a dose of IV Lasix. Will Monitor closely  Clinical Course    Patient's breathing appears improved, she is more comfortable. CXR c/w chf/edema. Will admit to hospitalist for further management ____________________________________________   FINAL CLINICAL IMPRESSION(S) / ED DIAGNOSES  Final diagnoses:  Shortness of breath  Respiratory distress  Acute on chronic systolic congestive heart failure (HCC)      NEW MEDICATIONS STARTED DURING THIS VISIT:  New Prescriptions   No medications on file     Note:  This document was prepared using Dragon voice recognition software and may include unintentional dictation errors.    Jene Everyobert Dwight Burdo, MD 06/20/16 [phone]

## 2016-06-20 NOTE — Progress Notes (Signed)
PHARMACIST - PHYSICIAN ORDER COMMUNICATION  CONCERNING: P&T Medication Policy on Herbal Medications  DESCRIPTION:  This patient's order for:  Cod Liver Oil and Co-Q 10 Caps  have been noted.  This(ese) product(s) is(are) classified as an "herbal" or natural product. Due to a lack of definitive safety studies or FDA approval, nonstandard manufacturing practices, plus the potential risk of unknown drug-drug interactions while on inpatient medications, the Pharmacy and Therapeutics Committee does not permit the use of "herbal" or natural products of this type within [facility].   ACTION TAKEN: The pharmacy department is unable to verify this(ese) order at this time Please reevaluate patient's clinical condition at discharge and address if the herbal or natural product(s) should be resumed at that time.

## 2016-06-20 NOTE — H&P (Signed)
[facility] [place] [facility]   PATIENT NAME: Kristin Ayers    MR#:  [id number]  DATE OF BIRTH:  10/01/1930  DATE OF ADMISSION:  06/20/2016  PRIMARY CARE PHYSICIAN: Laurier NancyKHAN,SHAUKAT A, MD   REQUESTING/REFERRING PHYSICIAN:   CHIEF COMPLAINT:   Chief Complaint  Patient presents with  . Respiratory Distress    HISTORY OF PRESENT ILLNESS: Kristin Ayers  is a 82 y.o. female with a known history of Congestive heart failure, atrial fibrillation, hyperlipidemia, hypertension presented to the emergency room with increased shortness of breath. Patient was recently managed at our hospital for heart failure and discharged home couple of days ago. Patient says she has been compliant with her medication but no distinctly short of breath for the last 2 days. She presented to the emergency room with difficulty breathing and she was initially put on oxygen via nonrebreather mask. Patient chest x-ray showed increased vascular congestion and she was in heart failure. Patient was given IV Lasix 40 mg in the emergency room and 400 mL of urine output was noted. Her breathing improved and she was weaned to oxygen via nasal cannula. Patient does not use any home oxygen. No complaints of any chest pain. Has history of orthopnea and proximal nocturnal dyspnea. No complaints of any cough, fever or chills. Hospitalist service was consulted for further care of the patient. In the emergency room patient received Vasotec, Lasix and aspirin.  PAST MEDICAL HISTORY:   Past Medical History:  Diagnosis Date  . Atrial fibrillation (HCC)   . CHF (congestive heart failure) (HCC)   . Hyperlipidemia   . Hypertension     PAST SURGICAL HISTORY: Past Surgical History:  Procedure Laterality Date  . TONSILLECTOMY      SOCIAL HISTORY:  Social History  Substance Use Topics  . Smoking status: Former Smoker    Quit date: 1979  . Smokeless tobacco: Never Used  . Alcohol use No    FAMILY HISTORY:   Family History  Problem Relation Age of Onset  . CAD Mother   . Stroke Father   . Breast cancer Sister     DRUG ALLERGIES:  Allergies  Allergen Reactions  . Diltiazem Other (See Comments)    "gave me heart failure"  . Amlodipine Other (See Comments)    Dizziness   . Codeine Nausea Only  . Hydralazine Other (See Comments)    Shaking all over   . Aldomet [Methyldopa] Rash    REVIEW OF SYSTEMS:   CONSTITUTIONAL: No fever, fatigue or weakness.  EYES: No blurred or double vision.  EARS, NOSE, AND THROAT: No tinnitus or ear pain.  RESPIRATORY: Has shortness of breath,  No cough,wheezing or hemoptysis.  CARDIOVASCULAR: No chest pain,  Has orthopnea, edema.  GASTROINTESTINAL: No nausea, vomiting, diarrhea or abdominal pain.  GENITOURINARY: No dysuria, hematuria.  ENDOCRINE: No polyuria, nocturia,  HEMATOLOGY: No anemia, easy bruising or bleeding SKIN: No rash or lesion. MUSCULOSKELETAL: No joint pain or arthritis.   NEUROLOGIC: No tingling, numbness, weakness.  PSYCHIATRY: No anxiety or depression.   MEDICATIONS AT HOME:  Prior to Admission medications   Medication Sig Start Date End Date Taking? Authorizing Provider  amiodarone (PACERONE) 200 MG tablet Take 200 mg by mouth daily.   Yes Historical Provider, MD  apixaban (ELIQUIS) 2.5 MG TABS tablet Take 2.5 mg by mouth 2 (two) times daily.   Yes Historical Provider, MD  carvedilol (COREG) 3.125 MG tablet Take 3.125 mg by mouth 2 (two) times daily with  a meal.   Yes Historical Provider, MD  cholecalciferol (VITAMIN D) 1000 units tablet Take 1,000 Units by mouth daily.   Yes Historical Provider, MD  [facility] 1000 MG CAPS Take 2,000 mg by mouth daily.   Yes Historical Provider, MD  Coenzyme Q10 (CO Q-10) 200 MG CAPS Take 1 capsule by mouth daily.   Yes Historical Provider, MD  furosemide (LASIX) 20 MG tablet Take 1 tablet (20 mg total) by mouth every other day. 06/13/16  Yes Enedina FinnerSona Patel, MD  potassium chloride SA  (K-DUR,KLOR-CON) 20 MEQ tablet Take 20 mEq by mouth 2 (two) times daily.   Yes Historical Provider, MD  valsartan (DIOVAN) 160 MG tablet Take 160 mg by mouth daily.   Yes Historical Provider, MD  vitamin B-12 (CYANOCOBALAMIN) 1000 MCG tablet Take 1,000 mcg by mouth daily.   Yes Historical Provider, MD  spironolactone (ALDACTONE) 25 MG tablet Take 25 mg by mouth 2 (two) times daily.    Historical Provider, MD      PHYSICAL EXAMINATION:   VITAL SIGNS: Blood pressure (!) 146/64, pulse 62, resp. rate (!) 27, weight 63.5 kg (140 lb), SpO2 97 %.  GENERAL:  82 y.o.-year-old patient lying in the bed in mild respiratory distress.  EYES: Pupils equal, round, reactive to light and accommodation. No scleral icterus. Extraocular muscles intact.  HEENT: Head atraumatic, normocephalic. Oropharynx and nasopharynx clear.  NECK:  Supple, no jugular venous distention. No thyroid enlargement, no tenderness.  LUNGS: Decreased breath sounds bilaterally,bibasilar crepitations heard. No use of accessory muscles of respiration.  CARDIOVASCULAR: S1, S2 normal. No murmurs, rubs, or gallops.  ABDOMEN: Soft, nontender, nondistended. Bowel sounds present. No organomegaly or mass.  EXTREMITIES: Has 1 plus pedal edema,  no cyanosis and clubbing.  NEUROLOGIC: Cranial nerves II through XII are intact. Muscle strength 5/5 in all extremities. Sensation intact. Gait not checked.  PSYCHIATRIC: The patient is alert and oriented x 3.  SKIN: No obvious rash, lesion, or ulcer.   LABORATORY PANEL:   CBC  Recent Labs Lab 06/20/16 0230  WBC 10.1  HGB 9.1*  HCT 28.2*  PLT 329  MCV 80.5  MCH 26.0  MCHC 32.3  RDW 17.0*   ------------------------------------------------------------------------------------------------------------------  Chemistries   Recent Labs Lab 06/20/16 0230  NA 138  K 3.9  CL 105  CO2 26  GLUCOSE 191*  BUN 26*  CREATININE 1.47*  CALCIUM 9.6  AST 28  ALT 22  ALKPHOS 47  BILITOT 0.8    ------------------------------------------------------------------------------------------------------------------ estimated creatinine clearance is 27.1 mL/min (by C-G formula based on SCr of 1.47 mg/dL (H)). ------------------------------------------------------------------------------------------------------------------ No results for input(s): TSH, T4TOTAL, T3FREE, THYROIDAB in the last 72 hours.  Invalid input(s): FREET3   Coagulation profile No results for input(s): INR, PROTIME in the last 168 hours. ------------------------------------------------------------------------------------------------------------------- No results for input(s): DDIMER in the last 72 hours. -------------------------------------------------------------------------------------------------------------------  Cardiac Enzymes  Recent Labs Lab 06/13/16 0834 06/13/16 1414 06/20/16 0230  TROPONINI 0.04* 0.04* <0.03   ------------------------------------------------------------------------------------------------------------------ Invalid input(s): POCBNP  ---------------------------------------------------------------------------------------------------------------  Urinalysis No results found for: COLORURINE, APPEARANCEUR, LABSPEC, PHURINE, GLUCOSEU, HGBUR, BILIRUBINUR, KETONESUR, PROTEINUR, UROBILINOGEN, NITRITE, LEUKOCYTESUR   RADIOLOGY: Dg Chest Portable 1 View  Result Date: 06/20/2016 CLINICAL DATA:  Respiratory distress, shortness of breath. EXAM: PORTABLE CHEST 1 VIEW COMPARISON:  06/13/2016 FINDINGS: Worsening cardiomegaly. Atherosclerosis of the thoracic aorta. Increased pulmonary edema with development of small pleural effusions. No focal airspace disease. Bones are under mineralized with remote fracture right proximal humerus. IMPRESSION: Worsening CHF. Thoracic aortic atherosclerosis. Electronically Signed  By: Rubye Oaks M.D.   On: 06/20/2016 02:51    EKG: Orders placed or  performed during the hospital encounter of 06/20/16  . ED EKG  . ED EKG    IMPRESSION AND PLAN: 82 year old female patient with history of congestive heart failure, atrial fibrillation, hypertension, hyperlipidemia presented to the emergency room with increased shortness of breath. Admitting diagnosis: 1. Decompensated heart failure 2. Dyspnea secondary to heart failure exacerbation 3. Chronic atrial fibrillation 4. Hypertension Treatment plan: Admit patient to telemetry inpatient service Diurese patient with IV Lasix 40 mg every 12 hourly Resume eliquis orally for anticoagulation Resume oral beta blocker, amiodarone, Aldactone and valsartan Low-salt diet Cycle troponin to rule out ischemia Cardiology consultation with Dr. Darryl Lent old echocardiogram report.  All the records are reviewed and case discussed with ED provider. Management plans discussed with the patient, family and they are in agreement.  CODE STATUS:FULL CODE Surrogate Decision maker : Daughter Denny Peon Code Status History    Date Active Date Inactive Code Status Order ID Comments User Context   06/13/2016  5:35 AM 06/13/2016  7:27 PM Full Code [id number]  Arnaldo Natal, MD Inpatient    Advance Directive Documentation   Flowsheet Row Most Recent Value  Type of Advance Directive  Living will  Pre-existing out of facility DNR order (yellow form or pink MOST form)  No data  "MOST" Form in Place?  No data       TOTAL TIME TAKING CARE OF THIS PATIENT: 54 minutes.    Ihor Austin M.D on 06/20/2016 at 3:54 AM  Between 7am to 6pm - Pager - [phone]  After 6pm go to [email or web address] - password [email or web address] [facility]  [facility]  Office  [phone]  CC: Primary care physician; Laurier Nancy, MD

## 2016-06-20 NOTE — ED Triage Notes (Signed)
Pt arrives with resp distress, recent chf admission. Pt with orthopnea, ems states pt with ra pox of 79%, pt on NRB at 10lpm. Skin warm and dry. Ems gave 324mg  asa and one spray ntg. Ems states pressure 245/127. Pt with labored resps noted.

## 2016-06-20 NOTE — ED Notes (Signed)
hospitalist in to see pt.

## 2016-06-20 NOTE — ED Notes (Signed)
Pt with improved resp status, no wheezing auscultated, pt is now able to speak in full sentences. Crackles auscultated in bilateral lower lobes. Family at bedside, call bell in right hand.

## 2016-06-21 ENCOUNTER — Inpatient Hospital Stay: Payer: Medicare Other

## 2016-06-21 LAB — BASIC METABOLIC PANEL
Anion gap: 6 (ref 5–15)
BUN: 27 mg/dL — AB (ref 6–20)
CO2: 31 mmol/L (ref 22–32)
Calcium: 9.3 mg/dL (ref 8.9–10.3)
Chloride: 103 mmol/L (ref 101–111)
Creatinine, Ser: 1.68 mg/dL — ABNORMAL HIGH (ref 0.44–1.00)
GFR calc Af Amer: 31 mL/min — ABNORMAL LOW (ref >60)
GFR, EST NON AFRICAN AMERICAN: 27 mL/min — AB (ref >60)
GLUCOSE: 91 mg/dL (ref 65–99)
POTASSIUM: 3.8 mmol/L (ref 3.5–5.1)
Sodium: 140 mmol/L (ref 135–145)

## 2016-06-21 LAB — BLOOD CULTURE ID PANEL (REFLEXED)
ACINETOBACTER BAUMANNII: NOT DETECTED
CANDIDA KRUSEI: NOT DETECTED
CARBAPENEM RESISTANCE: NOT DETECTED
Candida albicans: NOT DETECTED
Candida glabrata: NOT DETECTED
Candida parapsilosis: NOT DETECTED
Candida tropicalis: NOT DETECTED
ENTEROCOCCUS SPECIES: NOT DETECTED
Enterobacter cloacae complex: NOT DETECTED
Enterobacteriaceae species: NOT DETECTED
Escherichia coli: NOT DETECTED
HAEMOPHILUS INFLUENZAE: NOT DETECTED
Klebsiella oxytoca: NOT DETECTED
Klebsiella pneumoniae: NOT DETECTED
LISTERIA MONOCYTOGENES: NOT DETECTED
METHICILLIN RESISTANCE: NOT DETECTED
NEISSERIA MENINGITIDIS: NOT DETECTED
Proteus species: NOT DETECTED
Pseudomonas aeruginosa: NOT DETECTED
SERRATIA MARCESCENS: NOT DETECTED
STAPHYLOCOCCUS SPECIES: NOT DETECTED
STREPTOCOCCUS PYOGENES: NOT DETECTED
STREPTOCOCCUS SPECIES: NOT DETECTED
Staphylococcus aureus (BCID): NOT DETECTED
Streptococcus agalactiae: NOT DETECTED
Streptococcus pneumoniae: NOT DETECTED
Vancomycin resistance: NOT DETECTED

## 2016-06-21 NOTE — Progress Notes (Signed)
PHARMACY - PHYSICIAN COMMUNICATION CRITICAL VALUE ALERT - BLOOD CULTURE IDENTIFICATION (BCID)  Results for orders placed or performed during the hospital encounter of 06/20/16  Blood Culture ID Panel (Reflexed) (Collected: 06/20/2016  2:51 AM)  Result Value Ref Range   Enterococcus species NOT DETECTED NOT DETECTED   Vancomycin resistance NOT DETECTED NOT DETECTED   Listeria monocytogenes NOT DETECTED NOT DETECTED   Staphylococcus species NOT DETECTED NOT DETECTED   Staphylococcus aureus NOT DETECTED NOT DETECTED   Methicillin resistance NOT DETECTED NOT DETECTED   Streptococcus species NOT DETECTED NOT DETECTED   Streptococcus agalactiae NOT DETECTED NOT DETECTED   Streptococcus pneumoniae NOT DETECTED NOT DETECTED   Streptococcus pyogenes NOT DETECTED NOT DETECTED   Acinetobacter baumannii NOT DETECTED NOT DETECTED   Enterobacteriaceae species NOT DETECTED NOT DETECTED   Enterobacter cloacae complex NOT DETECTED NOT DETECTED   Escherichia coli NOT DETECTED NOT DETECTED   Klebsiella oxytoca NOT DETECTED NOT DETECTED   Klebsiella pneumoniae NOT DETECTED NOT DETECTED   Proteus species NOT DETECTED NOT DETECTED   Serratia marcescens NOT DETECTED NOT DETECTED   Carbapenem resistance NOT DETECTED NOT DETECTED   Haemophilus influenzae NOT DETECTED NOT DETECTED   Neisseria meningitidis NOT DETECTED NOT DETECTED   Pseudomonas aeruginosa NOT DETECTED NOT DETECTED   Candida albicans NOT DETECTED NOT DETECTED   Candida glabrata NOT DETECTED NOT DETECTED   Candida krusei NOT DETECTED NOT DETECTED   Candida parapsilosis NOT DETECTED NOT DETECTED   Candida tropicalis NOT DETECTED NOT DETECTED    Name of physician (or Provider) ContactedElisabeth Pigeon: Vachhani  Changes to prescribed antibiotics required: None  Cindi CarbonMary M Anneli Bing, PharmD, BCPS Clinical Pharmacist 06/21/2016  3:31 PM

## 2016-06-21 NOTE — Progress Notes (Signed)
[employer] - [place] at [facility]   PATIENT NAME: Kristin EvesJoanne Ayers    MR#:  [id number]  DATE OF BIRTH:  03/30/1931  SUBJECTIVE:  CHIEF COMPLAINT:   Chief Complaint  Patient presents with  . Respiratory Distress    Came with SOB, recent admission for CHF. Feels better after some diuresis.  Renal function is slightly worse. The patient is completely off oxygen and on room air now.  REVIEW OF SYSTEMS:  CONSTITUTIONAL: No fever, fatigue or weakness.  EYES: No blurred or double vision.  EARS, NOSE, AND THROAT: No tinnitus or ear pain.  RESPIRATORY: No cough, positive for shortness of breath, wheezing or hemoptysis.  CARDIOVASCULAR: No chest pain, orthopnea, edema.  GASTROINTESTINAL: No nausea, vomiting, diarrhea or abdominal pain.  GENITOURINARY: No dysuria, hematuria.  ENDOCRINE: No polyuria, nocturia,  HEMATOLOGY: No anemia, easy bruising or bleeding SKIN: No rash or lesion. MUSCULOSKELETAL: No joint pain or arthritis.   NEUROLOGIC: No tingling, numbness, weakness.  PSYCHIATRY: No anxiety or depression.   ROS  DRUG ALLERGIES:   Allergies  Allergen Reactions  . Diltiazem Other (See Comments)    "gave me heart failure"  . Amlodipine Other (See Comments)    Dizziness   . Codeine Nausea Only  . Hydralazine Other (See Comments)    Shaking all over   . Aldomet [Methyldopa] Rash    VITALS:  Blood pressure (!) 133/46, pulse 60, temperature 98 F (36.7 C), temperature source Oral, resp. rate 18, height 5\' 6"  (1.676 m), weight 64.1 kg (141 lb 4.8 oz), SpO2 93 %.  PHYSICAL EXAMINATION:  GENERAL:  82 y.o.-year-old patient lying in the bed with no acute distress.  EYES: Pupils equal, round, reactive to light and accommodation. No scleral icterus. Extraocular muscles intact.  HEENT: Head atraumatic, normocephalic. Oropharynx and nasopharynx clear.  NECK:  Supple, no jugular venous distention. No thyroid enlargement, no tenderness.  LUNGS: Normal breath sounds  bilaterally, no wheezing, No crepitation. No use of accessory muscles of respiration. On Room air now. CARDIOVASCULAR: S1, S2 normal. No murmurs, rubs, or gallops.  ABDOMEN: Soft, nontender, nondistended. Bowel sounds present. No organomegaly or mass.  EXTREMITIES: No pedal edema, cyanosis, or clubbing.  NEUROLOGIC: Cranial nerves II through XII are intact. Muscle strength 5/5 in all extremities. Sensation intact. Gait not checked.  PSYCHIATRIC: The patient is alert and oriented x 3.  SKIN: No obvious rash, lesion, or ulcer.   Physical Exam LABORATORY PANEL:   CBC  Recent Labs Lab 06/20/16 0555  WBC 8.0  HGB 8.5*  HCT 26.2*  PLT 269   ------------------------------------------------------------------------------------------------------------------  Chemistries   Recent Labs Lab 06/20/16 0230  06/21/16 0559  NA 138  --  140  K 3.9  --  3.8  CL 105  --  103  CO2 26  --  31  GLUCOSE 191*  --  91  BUN 26*  --  27*  CREATININE 1.47*  < > 1.68*  CALCIUM 9.6  --  9.3  AST 28  --   --   ALT 22  --   --   ALKPHOS 47  --   --   BILITOT 0.8  --   --   < > = values in this interval not displayed. ------------------------------------------------------------------------------------------------------------------  Cardiac Enzymes  Recent Labs Lab 06/20/16 1137 06/20/16 1650  TROPONINI 0.04* 0.03*   ------------------------------------------------------------------------------------------------------------------  RADIOLOGY:  Dg Chest 2 View  Result Date: 06/21/2016 CLINICAL DATA:  Shortness of breath. EXAM: CHEST  2 VIEW COMPARISON:  June 20, 2016 FINDINGS: Bilateral pleural effusions, left greater than right, similar in the interval. Mild increased opacity in the lateral left lung base is nonspecific. No other changes. IMPRESSION: Small bilateral effusions with underlying atelectasis. Slight increased opacity in the left lateral lung base is nonspecific. Recommend  attention on follow-up. Electronically Signed   By: Gerome Samavid  Williams III M.D   On: 06/21/2016 09:57   Dg Chest Portable 1 View  Result Date: 06/20/2016 CLINICAL DATA:  Respiratory distress, shortness of breath. EXAM: PORTABLE CHEST 1 VIEW COMPARISON:  06/13/2016 FINDINGS: Worsening cardiomegaly. Atherosclerosis of the thoracic aorta. Increased pulmonary edema with development of small pleural effusions. No focal airspace disease. Bones are under mineralized with remote fracture right proximal humerus. IMPRESSION: Worsening CHF. Thoracic aortic atherosclerosis. Electronically Signed   By: Rubye OaksMelanie  Ehinger M.D.   On: 06/20/2016 02:51    ASSESSMENT AND PLAN:   Active Problems:   CHF (congestive heart failure) (HCC)  * Acute diastolic CHF, coronary artery disease   IV lasix   Monitor for fluid balance.   Appears slightly on dehydration side so we'll hold Lasix for now, no crepitations.   Appreciated cardiology consult, he suggested to her to have coronary angiogram in the past but patient refused. This time he gave follow-up appointment next week and suggesting to do CT angiogram.  * Ac on Ch Kidney disease CKD stage 3   Creatinine slightly worse so hold his Lasix and follow renal function tomorrow.  * hypertension   Cont home meds, stable.  * a fib   Rate controleld, amiodarone , on eliquis   All the records are reviewed and case discussed with Care Management/Social Workerr. Management plans discussed with the patient, family and they are in agreement.  CODE STATUS: Full.  TOTAL TIME TAKING CARE OF THIS PATIENT: 35 minutes.  Patient's daughter and her partner were present in the room, plan discussed with them.  POSSIBLE D/C IN 1-2 DAYS, DEPENDING ON CLINICAL CONDITION.   Altamese DillingVACHHANI, Kristin Hemler M.D on 06/21/2016   Between 7am to 6pm - Pager - [phone]  After 6pm go to [email or web address] - password [employer] [email or web address]  [facility] [place] [facility]  Office  [phone]  CC: Primary  care physician; Laurier NancyKHAN,SHAUKAT A, MD  Note: This dictation was prepared with Dragon dictation along with smaller phrase technology. Any transcriptional errors that result from this process are unintentional.

## 2016-06-21 NOTE — Progress Notes (Signed)
SUBJECTIVE: Patient is a was admitted with chest pain and shortness of breath after being discharged feeling better now.   Vitals:   06/20/16 1930 06/21/16 0417 06/21/16 0800 06/21/16 1123  BP: (!) 172/74 (!) 181/61 (!) 156/56 (!) 133/46  Pulse: 68 60 (!) 56 60  Resp: 16 16 16 18   Temp: 98.2 F (36.8 C) 98.2 F (36.8 C) 98.4 F (36.9 C) 98 F (36.7 C)  TempSrc: Oral  Oral Oral  SpO2: 99% 100% 99% 93%  Weight:  141 lb 4.8 oz (64.1 kg)    Height:        Intake/Output Summary (Last 24 hours) at 06/21/16 1312 Last data filed at 06/21/16 1121  Gross per 24 hour  Intake              360 ml  Output             1900 ml  Net            -1540 ml    LABS: Basic Metabolic Panel:  Recent Labs  16/04/9611/09/17 0230 06/20/16 0555 06/21/16 0559  NA 138  --  140  K 3.9  --  3.8  CL 105  --  103  CO2 26  --  31  GLUCOSE 191*  --  91  BUN 26*  --  27*  CREATININE 1.47* 1.46* 1.68*  CALCIUM 9.6  --  9.3   Liver Function Tests:  Recent Labs  06/20/16 0230  AST 28  ALT 22  ALKPHOS 47  BILITOT 0.8  PROT 7.1  ALBUMIN 3.3*   No results for input(s): LIPASE, AMYLASE in the last 72 hours. CBC:  Recent Labs  06/20/16 0230 06/20/16 0555  WBC 10.1 8.0  HGB 9.1* 8.5*  HCT 28.2* 26.2*  MCV 80.5 80.3  PLT 329 269   Cardiac Enzymes:  Recent Labs  06/20/16 0555 06/20/16 1137 06/20/16 1650  TROPONINI 0.03* 0.04* 0.03*   BNP: Invalid input(s): POCBNP D-Dimer: No results for input(s): DDIMER in the last 72 hours. Hemoglobin A1C: No results for input(s): HGBA1C in the last 72 hours. Fasting Lipid Panel: No results for input(s): CHOL, HDL, LDLCALC, TRIG, CHOLHDL, LDLDIRECT in the last 72 hours. Thyroid Function Tests: No results for input(s): TSH, T4TOTAL, T3FREE, THYROIDAB in the last 72 hours.  Invalid input(s): FREET3 Anemia Panel: No results for input(s): VITAMINB12, FOLATE, FERRITIN, TIBC, IRON, RETICCTPCT in the last 72 hours.   PHYSICAL EXAM General: Well  developed, well nourished, in no acute distress HEENT:  Normocephalic and atramatic Neck:  No JVD.  Lungs: Clear bilaterally to auscultation and percussion. Heart: HRRR . Normal S1 and S2 without gallops or murmurs.  Abdomen: Bowel sounds are positive, abdomen soft and non-tender  Msk:  Back normal, normal gait. Normal strength and tone for age. Extremities: No clubbing, cyanosis or edema.   Neuro: Alert and oriented X 3. Psych:  Good affect, responds appropriately  TELEMETRY:Sinus rhythm  ASSESSMENT AND PLAN: Coronary artery disease with history of PCI and stenting and recurrent chest pain and mildly elevated troponin and congestive heart failure. Patient was seen and recommended cardiac catheterization but has refused it. I went back and talked to the daughters and it was noted that cardiac catheterization and PCI and stenting was done 10 years ago and she does not want to have another procedure. Advise follow-up Tuesday at 1 PM and will consider doing CTA coronaries.  Active Problems:   CHF (congestive heart failure) (HCC)    Kristin Ayers  A, MD, [facility] 06/21/2016 1:12 PM

## 2016-06-21 NOTE — Progress Notes (Signed)
[employer] - [place] [facility]   PATIENT NAME: Kristin Ayers    MR#:  [id number]  DATE OF BIRTH:  10/20/1930  SUBJECTIVE:  CHIEF COMPLAINT:   Chief Complaint  Patient presents with  . Respiratory Distress    Came with SOB, recent admission for CHF. Feels better after some diuresis.  REVIEW OF SYSTEMS:  CONSTITUTIONAL: No fever, fatigue or weakness.  EYES: No blurred or double vision.  EARS, NOSE, AND THROAT: No tinnitus or ear pain.  RESPIRATORY: No cough, positive for shortness of breath, wheezing or hemoptysis.  CARDIOVASCULAR: No chest pain, orthopnea, edema.  GASTROINTESTINAL: No nausea, vomiting, diarrhea or abdominal pain.  GENITOURINARY: No dysuria, hematuria.  ENDOCRINE: No polyuria, nocturia,  HEMATOLOGY: No anemia, easy bruising or bleeding SKIN: No rash or lesion. MUSCULOSKELETAL: No joint pain or arthritis.   NEUROLOGIC: No tingling, numbness, weakness.  PSYCHIATRY: No anxiety or depression.   ROS  DRUG ALLERGIES:   Allergies  Allergen Reactions  . Diltiazem Other (See Comments)    "gave me heart failure"  . Amlodipine Other (See Comments)    Dizziness   . Codeine Nausea Only  . Hydralazine Other (See Comments)    Shaking all over   . Aldomet [Methyldopa] Rash    VITALS:  Blood pressure (!) 156/56, pulse (!) 56, temperature 98.4 F (36.9 C), temperature source Oral, resp. rate 16, height 5\' 6"  (1.676 m), weight 64.1 kg (141 lb 4.8 oz), SpO2 99 %.  PHYSICAL EXAMINATION:  GENERAL:  82 y.o.-year-old patient lying in the bed with no acute distress.  EYES: Pupils equal, round, reactive to light and accommodation. No scleral icterus. Extraocular muscles intact.  HEENT: Head atraumatic, normocephalic. Oropharynx and nasopharynx clear.  NECK:  Supple, no jugular venous distention. No thyroid enlargement, no tenderness.  LUNGS: Normal breath sounds bilaterally, no wheezing, some crepitation. No use of accessory muscles of respiration. On  nasal canula oxygen. CARDIOVASCULAR: S1, S2 normal. No murmurs, rubs, or gallops.  ABDOMEN: Soft, nontender, nondistended. Bowel sounds present. No organomegaly or mass.  EXTREMITIES: No pedal edema, cyanosis, or clubbing.  NEUROLOGIC: Cranial nerves II through XII are intact. Muscle strength 5/5 in all extremities. Sensation intact. Gait not checked.  PSYCHIATRIC: The patient is alert and oriented x 3.  SKIN: No obvious rash, lesion, or ulcer.   Physical Exam LABORATORY PANEL:   CBC  Recent Labs Lab 06/20/16 0555  WBC 8.0  HGB 8.5*  HCT 26.2*  PLT 269   ------------------------------------------------------------------------------------------------------------------  Chemistries   Recent Labs Lab 06/20/16 0230  06/21/16 0559  NA 138  --  140  K 3.9  --  3.8  CL 105  --  103  CO2 26  --  31  GLUCOSE 191*  --  91  BUN 26*  --  27*  CREATININE 1.47*  < > 1.68*  CALCIUM 9.6  --  9.3  AST 28  --   --   ALT 22  --   --   ALKPHOS 47  --   --   BILITOT 0.8  --   --   < > = values in this interval not displayed. ------------------------------------------------------------------------------------------------------------------  Cardiac Enzymes  Recent Labs Lab 06/20/16 1137 06/20/16 1650  TROPONINI 0.04* 0.03*   ------------------------------------------------------------------------------------------------------------------  RADIOLOGY:  Dg Chest Portable 1 View  Result Date: 06/20/2016 CLINICAL DATA:  Respiratory distress, shortness of breath. EXAM: PORTABLE CHEST 1 VIEW COMPARISON:  06/13/2016 FINDINGS: Worsening cardiomegaly. Atherosclerosis of the thoracic aorta. Increased pulmonary edema with  development of small pleural effusions. No focal airspace disease. Bones are under mineralized with remote fracture right proximal humerus. IMPRESSION: Worsening CHF. Thoracic aortic atherosclerosis. Electronically Signed   By: Rubye OaksMelanie  Ehinger M.D.   On: 06/20/2016 02:51     ASSESSMENT AND PLAN:   Active Problems:   CHF (congestive heart failure) (HCC)  * Acute diastolic CHF   IV lasix   Monitor for fluid balance.  * Ac on Ch Kidney disease CKD stage 3   Monitor renal func with IV lasix.  * hypertension   Cont home meds, stable.  * a fib   Rate controleld, amiodarone , on eliquis   All the records are reviewed and case discussed with Care Management/Social Workerr. Management plans discussed with the patient, family and they are in agreement.  CODE STATUS: Full.  TOTAL TIME TAKING CARE OF THIS PATIENT: 35 minutes.    POSSIBLE D/C IN 1-2 DAYS, DEPENDING ON CLINICAL CONDITION.   Altamese DillingVACHHANI, Taysom Glymph M.D on 06/21/2016   Between 7am to 6pm - Pager - [phone]  After 6pm go to [email or web address] - password [employer] [email or web address]  [facility]  Office  [phone]  CC: Primary care physician; Laurier NancyKHAN,SHAUKAT A, MD  Note: This dictation was prepared with Dragon dictation along with smaller phrase technology. Any transcriptional errors that result from this process are unintentional.

## 2016-06-22 LAB — BASIC METABOLIC PANEL
Anion gap: 4 — ABNORMAL LOW (ref 5–15)
BUN: 30 mg/dL — AB (ref 6–20)
CHLORIDE: 104 mmol/L (ref 101–111)
CO2: 32 mmol/L (ref 22–32)
CREATININE: 1.54 mg/dL — AB (ref 0.44–1.00)
Calcium: 9.4 mg/dL (ref 8.9–10.3)
GFR calc non Af Amer: 30 mL/min — ABNORMAL LOW (ref >60)
GFR, EST AFRICAN AMERICAN: 35 mL/min — AB (ref >60)
Glucose, Bld: 95 mg/dL (ref 65–99)
POTASSIUM: 3.7 mmol/L (ref 3.5–5.1)
SODIUM: 140 mmol/L (ref 135–145)

## 2016-06-22 MED ORDER — ASPIRIN 81 MG PO TBEC: 81.0000 mg | DELAYED_RELEASE_TABLET | Freq: Every day | ORAL | 0 refills | Status: AC

## 2016-06-22 MED ORDER — CARVEDILOL 6.25 MG PO TABS: 6.2500 mg | ORAL_TABLET | Freq: Two times a day (BID) | ORAL | 0 refills | Status: AC

## 2016-06-22 MED ORDER — SPIRONOLACTONE 25 MG PO TABS: 25.0000 mg | ORAL_TABLET | Freq: Every day | ORAL | 0 refills | Status: AC

## 2016-06-22 MED ORDER — SPIRONOLACTONE 25 MG PO TABS: 25.0000 mg | ORAL_TABLET | Freq: Every day | ORAL | Status: DC

## 2016-06-22 MED ORDER — AMOXICILLIN-POT CLAVULANATE 875-125 MG PO TABS: 1.0000 | ORAL_TABLET | Freq: Two times a day (BID) | ORAL | 0 refills | Status: AC

## 2016-06-22 NOTE — Plan of Care (Signed)
Problem: Fluid Volume: Goal: Ability to maintain a balanced intake and output will improve Outcome: Not Progressing Patient is complaining of dizzines with position change.

## 2016-06-22 NOTE — Progress Notes (Signed)
[facility]  Patient Status: Active  [facility] is providing the following services: SN/PT  If patient discharges after hours, please call [phone].   Scherrie GerlachJason E Hinton 06/22/2016, 2:20 PM

## 2016-06-22 NOTE — Care Management Important Message (Signed)
Important Message  Patient Details  Name: Kristin Ayers MRN: [id number] Date of Birth: 04/10/1931   Medicare Important Message Given:  Yes    Rodel Glaspy A, RN 06/22/2016, 7:56 AM

## 2016-06-22 NOTE — Discharge Instructions (Signed)
Heart Failure Clinic appointment on July 09, 2016 at 11:00am with Kristin Kindredina Gao Mitnick, FNP. Please call [phone] to reschedule.

## 2016-06-22 NOTE — Progress Notes (Signed)
SUBJECTIVE: Patient denies any chest pain or shortness of breath   Vitals:   06/22/16 0500 06/22/16 0504 06/22/16 0506 06/22/16 0508  BP:  (!) 186/60 (!) 185/70 (!) 173/69  Pulse:  63 63 67  Resp:      Temp:  98.7 F (37.1 C)    TempSrc:  Oral    SpO2:  98% 97% 97%  Weight: 136 lb 9.6 oz (62 kg)   136 lb 9.6 oz (62 kg)  Height:        Intake/Output Summary (Last 24 hours) at 06/22/16 0836 Last data filed at 06/22/16 0413  Gross per 24 hour  Intake              120 ml  Output             1000 ml  Net             -880 ml    LABS: Basic Metabolic Panel:  Recent Labs  91/47/8211/04/29 0559 06/22/16 0452  NA 140 140  K 3.8 3.7  CL 103 104  CO2 31 32  GLUCOSE 91 95  BUN 27* 30*  CREATININE 1.68* 1.54*  CALCIUM 9.3 9.4   Liver Function Tests:  Recent Labs  06/20/16 0230  AST 28  ALT 22  ALKPHOS 47  BILITOT 0.8  PROT 7.1  ALBUMIN 3.3*   No results for input(s): LIPASE, AMYLASE in the last 72 hours. CBC:  Recent Labs  06/20/16 0230 06/20/16 0555  WBC 10.1 8.0  HGB 9.1* 8.5*  HCT 28.2* 26.2*  MCV 80.5 80.3  PLT 329 269   Cardiac Enzymes:  Recent Labs  06/20/16 0555 06/20/16 1137 06/20/16 1650  TROPONINI 0.03* 0.04* 0.03*   BNP: Invalid input(s): POCBNP D-Dimer: No results for input(s): DDIMER in the last 72 hours. Hemoglobin A1C: No results for input(s): HGBA1C in the last 72 hours. Fasting Lipid Panel: No results for input(s): CHOL, HDL, LDLCALC, TRIG, CHOLHDL, LDLDIRECT in the last 72 hours. Thyroid Function Tests: No results for input(s): TSH, T4TOTAL, T3FREE, THYROIDAB in the last 72 hours.  Invalid input(s): FREET3 Anemia Panel: No results for input(s): VITAMINB12, FOLATE, FERRITIN, TIBC, IRON, RETICCTPCT in the last 72 hours.   PHYSICAL EXAM General: Well developed, well nourished, in no acute distress HEENT:  Normocephalic and atramatic Neck:  No JVD.  Lungs: Clear bilaterally to auscultation and percussion. Heart: HRRR . Normal S1  and S2 without gallops or murmurs.  Abdomen: Bowel sounds are positive, abdomen soft and non-tender  Msk:  Back normal, normal gait. Normal strength and tone for age. Extremities: No clubbing, cyanosis or edema.   Neuro: Alert and oriented X 3. Psych:  Good affect, responds appropriately  TELEMETRY: Sinus rhythm  ASSESSMENT AND PLAN: Congestive heart failure with coronary artery disease status post PCI stenting 10 years ago with mildly elevated troponin and chest pain. Patient does not want to have cardiac catheterization thus will do outpatient workup by doing a stress test or CTA coronaries. Patient already been given appointment for next Tuesday.  Active Problems:   CHF (congestive heart failure) (HCC)    Norberto Wishon A, MD, [facility] 06/22/2016 8:36 AM

## 2016-06-22 NOTE — Progress Notes (Signed)
Pt to be discharged this pm. Iv and tele removed. disch instructions and prescirps given to pt and her daughter. disch via w.c. Daughter to transport.

## 2016-06-22 NOTE — Care Management Note (Signed)
Case Management Note  Patient Details  Name: Kristin Ayers MRN: [id number] Date of Birth: 09/26/1930  Subjective/Objective:     Discussed discharge planning with [name]'s daughter Denny Peonrin. Family's plan is to [facility] [name] [facility]. [facility] at [facility] notified to resume home health PT and RN services. No other discharge needs identified by [name]'s daughter Denny Peonrin. Daughter Denny Peonrin reports that at this time the family has no plans to place [name] in Assisted Living and will continue to assist [name] in her Independent Living apartment.                Action/Plan:   Expected Discharge Date:                  Expected Discharge Plan:     In-House Referral:     Discharge planning Services     Post Acute Care Choice:    Choice offered to:     DME Arranged:    DME Agency:     HH Arranged:    HH Agency:     Status of Service:     If discussed at [employer] Length of Stay Meetings, dates discussed:    Additional Comments:  Zebulen Simonis A, RN 06/22/2016, 3:52 PM

## 2016-06-23 LAB — CULTURE, BLOOD (ROUTINE X 2)

## 2016-06-23 NOTE — Discharge Summary (Signed)
[facility] - [place] at [facility]   PATIENT NAME: Kristin Ayers    MR#:  [id number]  DATE OF BIRTH:  Apr 08, 1931  DATE OF ADMISSION:  06/20/2016 ADMITTING PHYSICIAN: Ihor Austin, MD  DATE OF DISCHARGE: 06/22/2016  4:25 PM  PRIMARY CARE PHYSICIAN: Laurier Nancy, MD    ADMISSION DIAGNOSIS:  Shortness of breath [R06.02] Respiratory distress [R06.00] Acute on chronic systolic congestive heart failure (HCC) [I50.23]  DISCHARGE DIAGNOSIS:  Active Problems:   CHF (congestive heart failure) (HCC)   Acute diastolic CHF  SECONDARY DIAGNOSIS:   Past Medical History:  Diagnosis Date  . Atrial fibrillation (HCC)   . CHF (congestive heart failure) (HCC)   . Hyperlipidemia   . Hypertension     HOSPITAL COURSE:   * Acute diastolic CHF, coronary artery disease   IV lasix   Monitor for fluid balance.   Appears slightly on dehydration side so we'll hold Lasix for now, no crepitations.   Appreciated cardiology consult, he suggested to her to have coronary angiogram in the past but patient refused. This time he gave follow-up appointment next week and suggesting to do CT coronary angiogram.  * Ac on Ch Kidney disease CKD stage 3   Creatinine slightly worse so hold his Lasix and follow renal function tomorrow.   Renal func improving, advise pt to follow with cardiologist or PMD in next one week to check her kidney function.  * hypertension   Cont home meds, stable.  * a fib   Rate controleld, amiodarone , on eliquis  * positive blood culture   It was only one out of two, pt did not have any fever and repeat cx is also negative.   Likely contamination and no further actions needed.   DISCHARGE CONDITIONS:   Stable.  CONSULTS OBTAINED:  Treatment Team:  Laurier Nancy, MD  DRUG ALLERGIES:   Allergies  Allergen Reactions  . Diltiazem Other (See Comments)    "gave me heart failure"  . Amlodipine Other (See Comments)    Dizziness   .  Codeine Nausea Only  . Hydralazine Other (See Comments)    Shaking all over   . Aldomet [Methyldopa] Rash    DISCHARGE MEDICATIONS:   Discharge Medication List as of 06/22/2016  3:56 PM    START taking these medications   Details  aspirin EC 81 MG EC tablet Take 1 tablet (81 mg total) by mouth daily., Starting Tue 06/23/2016, Print      CONTINUE these medications which have CHANGED   Details  carvedilol (COREG) 6.25 MG tablet Take 1 tablet (6.25 mg total) by mouth 2 (two) times daily with a meal., Starting Mon 06/22/2016, Print    spironolactone (ALDACTONE) 25 MG tablet Take 1 tablet (25 mg total) by mouth daily., Starting Mon 06/22/2016, Print      CONTINUE these medications which have NOT CHANGED   Details  amiodarone (PACERONE) 200 MG tablet Take 200 mg by mouth daily., Historical Med    apixaban (ELIQUIS) 2.5 MG TABS tablet Take 2.5 mg by mouth 2 (two) times daily., Historical Med    cholecalciferol (VITAMIN D) 1000 units tablet Take 1,000 Units by mouth daily., Historical Med    Cod Liver Oil 1000 MG CAPS Take 2,000 mg by mouth daily., Historical Med    Coenzyme Q10 (CO Q-10) 200 MG CAPS Take 1 capsule by mouth daily., Historical Med    furosemide (LASIX) 20 MG tablet Take 1 tablet (20 mg total) by mouth every  other day., Starting Sat 06/13/2016, Normal    potassium chloride SA (K-DUR,KLOR-CON) 20 MEQ tablet Take 20 mEq by mouth 2 (two) times daily., Historical Med    valsartan (DIOVAN) 160 MG tablet Take 160 mg by mouth daily., Historical Med    vitamin B-12 (CYANOCOBALAMIN) 1000 MCG tablet Take 1,000 mcg by mouth daily., Historical Med         DISCHARGE INSTRUCTIONS:    Follow with cardiology in 2-3 days.  If you experience worsening of your admission symptoms, develop shortness of breath, life threatening emergency, suicidal or homicidal thoughts you must seek medical attention immediately by calling 911 or calling your MD immediately  if symptoms less  severe.  You Must read complete instructions/literature along with all the possible adverse reactions/side effects for all the Medicines you take and that have been prescribed to you. Take any new Medicines after you have completely understood and accept all the possible adverse reactions/side effects.   Please note  You were cared for by a hospitalist during your hospital stay. If you have any questions about your discharge medications or the care you received while you were in the hospital after you are discharged, you can call the unit and asked to speak with the hospitalist on call if the hospitalist that took care of you is not available. Once you are discharged, your primary care physician will handle any further medical issues. Please note that NO REFILLS for any discharge medications will be authorized once you are discharged, as it is imperative that you return to your primary care physician (or establish a relationship with a primary care physician if you do not have one) for your aftercare needs so that they can reassess your need for medications and monitor your lab values.    Today   CHIEF COMPLAINT:   Chief Complaint  Patient presents with  . Respiratory Distress    HISTORY OF PRESENT ILLNESS:  Kristin Ayers  is a 82 y.o. female with a known history of Congestive heart failure, atrial fibrillation, hyperlipidemia, hypertension presented to the emergency room with increased shortness of breath. Patient was recently managed at our hospital for heart failure and discharged home couple of days ago. Patient says she has been compliant with her medication but no distinctly short of breath for the last 2 days. She presented to the emergency room with difficulty breathing and she was initially put on oxygen via nonrebreather mask. Patient chest x-ray showed increased vascular congestion and she was in heart failure. Patient was given IV Lasix 40 mg in the emergency room and 400 mL of urine  output was noted. Her breathing improved and she was weaned to oxygen via nasal cannula. Patient does not use any home oxygen. No complaints of any chest pain. Has history of orthopnea and proximal nocturnal dyspnea. No complaints of any cough, fever or chills. Hospitalist service was consulted for further care of the patient. In the emergency room patient received Vasotec, Lasix and aspirin.   VITAL SIGNS:  Blood pressure (!) 127/46, pulse (!) 54, temperature 98.2 F (36.8 C), temperature source Oral, resp. rate 12, height 5\' 6"  (1.676 m), weight 62 kg (136 lb 9.6 oz), SpO2 94 %.  I/O:   Intake/Output Summary (Last 24 hours) at 06/23/16 0741 Last data filed at 06/22/16 1451  Gross per 24 hour  Intake              480 ml  Output  0 ml  Net              480 ml    PHYSICAL EXAMINATION:   GENERAL:  [age] y.o.-year-old patient lying in the bed with no acute distress.  EYES: Pupils equal, round, reactive to light and accommodation. No scleral icterus. Extraocular muscles intact.  HEENT: Head atraumatic, normocephalic. Oropharynx and nasopharynx clear.  NECK:  Supple, no jugular venous distention. No thyroid enlargement, no tenderness.  LUNGS: Normal breath sounds bilaterally, no wheezing, No crepitation. No use of accessory muscles of respiration. On Room air now. CARDIOVASCULAR: S1, S2 normal. No murmurs, rubs, or gallops.  ABDOMEN: Soft, nontender, nondistended. Bowel sounds present. No organomegaly or mass.  EXTREMITIES: No pedal edema, cyanosis, or clubbing.  NEUROLOGIC: Cranial nerves II through XII are intact. Muscle strength 5/5 in all extremities. Sensation intact. Gait not checked.  PSYCHIATRIC: The patient is alert and oriented x 3.  SKIN: No obvious rash, lesion, or ulcer.   DATA REVIEW:   CBC  Recent Labs Lab 06/20/16 0555  WBC 8.0  HGB 8.5*  HCT 26.2*  PLT 269    Chemistries   Recent Labs Lab 06/20/16 0230  06/22/16 0452  NA 138  < > 140  K 3.9  <  > 3.7  CL 105  < > 104  CO2 26  < > 32  GLUCOSE 191*  < > 95  BUN 26*  < > 30*  CREATININE 1.47*  < > 1.54*  CALCIUM 9.6  < > 9.4  AST 28  --   --   ALT 22  --   --   ALKPHOS 47  --   --   BILITOT 0.8  --   --   < > = values in this interval not displayed.  Cardiac Enzymes  Recent Labs Lab 06/20/16 1650  TROPONINI 0.03*    Microbiology Results  Results for orders placed or performed during the hospital encounter of 06/20/16  Blood culture (routine x 2)     Status: Abnormal (Preliminary result)   Collection Time: 06/20/16  2:51 AM  Result Value Ref Range Status   Specimen Description BLOOD RIGHT ANTECUBITAL  Final   Special Requests BOTTLES DRAWN AEROBIC AND ANAEROBIC [facility]  Final   Culture  Setup Time   Final    GRAM POSITIVE RODS AEROBIC BOTTLE ONLY CRITICAL RESULT CALLED TO, READ BACK BY AND VERIFIED WITH: Keturah BarreMary Swayne @ [id number] 06/21/16 by [facility]    Culture (A)  Final    BACILLUS SPECIES Standardized susceptibility testing for this organism is not available. [facility]    Report Status PENDING  Incomplete  Blood culture (routine x 2)     Status: None (Preliminary result)   Collection Time: 06/20/16  2:51 AM  Result Value Ref Range Status   Specimen Description BLOOD BLOOD LEFT FOREARM  Final   Special Requests BOTTLES DRAWN AEROBIC AND ANAEROBIC AER3CC,ANA3CC  Final   Culture NO GROWTH 2 DAYS  Final   Report Status PENDING  Incomplete  Blood Culture ID Panel (Reflexed)     Status: None   Collection Time: 06/20/16  2:51 AM  Result Value Ref Range Status   Enterococcus species NOT DETECTED NOT DETECTED Final   Vancomycin resistance NOT DETECTED NOT DETECTED Final   Listeria monocytogenes NOT DETECTED NOT DETECTED Final   Staphylococcus species NOT DETECTED NOT DETECTED Final   Staphylococcus aureus NOT DETECTED NOT DETECTED Final   Methicillin resistance NOT DETECTED NOT DETECTED  Final   Streptococcus species NOT DETECTED NOT DETECTED  Final   Streptococcus agalactiae NOT DETECTED NOT DETECTED Final   Streptococcus pneumoniae NOT DETECTED NOT DETECTED Final   Streptococcus pyogenes NOT DETECTED NOT DETECTED Final   Acinetobacter baumannii NOT DETECTED NOT DETECTED Final   Enterobacteriaceae species NOT DETECTED NOT DETECTED Final   Enterobacter cloacae complex NOT DETECTED NOT DETECTED Final   Escherichia coli NOT DETECTED NOT DETECTED Final   Klebsiella oxytoca NOT DETECTED NOT DETECTED Final   Klebsiella pneumoniae NOT DETECTED NOT DETECTED Final   Proteus species NOT DETECTED NOT DETECTED Final   Serratia marcescens NOT DETECTED NOT DETECTED Final   Carbapenem resistance NOT DETECTED NOT DETECTED Final   Haemophilus influenzae NOT DETECTED NOT DETECTED Final   Neisseria meningitidis NOT DETECTED NOT DETECTED Final   Pseudomonas aeruginosa NOT DETECTED NOT DETECTED Final   Candida albicans NOT DETECTED NOT DETECTED Final   Candida glabrata NOT DETECTED NOT DETECTED Final   Candida krusei NOT DETECTED NOT DETECTED Final   Candida parapsilosis NOT DETECTED NOT DETECTED Final   Candida tropicalis NOT DETECTED NOT DETECTED Final  Culture, blood (single) w Reflex to ID Panel     Status: None (Preliminary result)   Collection Time: 06/21/16  3:52 PM  Result Value Ref Range Status   Specimen Description BLOOD RIGHT HAND  Final   Special Requests BOTTLES DRAWN AEROBIC AND ANAEROBIC 5CC  Final   Culture NO GROWTH < 24 HOURS  Final   Report Status PENDING  Incomplete    RADIOLOGY:  Dg Chest 2 View  Result Date: 06/21/2016 CLINICAL DATA:  Shortness of breath. EXAM: CHEST  2 VIEW COMPARISON:  June 20, 2016 FINDINGS: Bilateral pleural effusions, left greater than right, similar in the interval. Mild increased opacity in the lateral left lung base is nonspecific. No other changes. IMPRESSION: Small bilateral effusions with underlying atelectasis. Slight increased opacity in the left lateral lung base is nonspecific.  Recommend attention on follow-up. Electronically Signed   By: Gerome Sam III M.D   On: 06/21/2016 09:57    EKG:   Orders placed or performed during the hospital encounter of 06/20/16  . ED EKG  . ED EKG      Management plans discussed with the patient, family and they are in agreement.  CODE STATUS:  Code Status History    Date Active Date Inactive Code Status Order ID Comments User Context   06/20/2016  5:13 AM 06/22/2016  7:30 PM Full Code [id number]  Ihor Austin, MD Inpatient   06/13/2016  5:35 AM 06/13/2016  7:27 PM Full Code [id number]  Arnaldo Natal, MD Inpatient    Advance Directive Documentation   Flowsheet Row Most Recent Value  Type of Advance Directive  Living will  Pre-existing out of facility DNR order (yellow form or pink MOST form)  No data  "MOST" Form in Place?  No data      TOTAL TIME TAKING CARE OF THIS PATIENT: 35 minutes.    Altamese Dilling M.D on 06/23/2016 at 7:41 AM  Between 7am to 6pm - Pager - [phone]  After 6pm go to [email or web address] - password [email or web address]  [facility]  Office  [phone]  CC: Primary care physician; Laurier Nancy, MD   Note: This dictation was prepared with Dragon dictation along with smaller phrase technology. Any transcriptional errors that result from this process are unintentional.

## 2016-06-25 LAB — CULTURE, BLOOD (ROUTINE X 2): Culture: NO GROWTH

## 2016-06-26 LAB — CULTURE, BLOOD (SINGLE): Culture: NO GROWTH

## 2016-07-09 ENCOUNTER — Ambulatory Visit: Payer: Medicare Other | Admitting: Family

## 2016-07-09 ENCOUNTER — Telehealth: Payer: Self-pay | Admitting: Family

## 2016-07-09 NOTE — Telephone Encounter (Signed)
Patient missed her initial appointment at [facility] on 07/09/16. Will attempt to reschedule.

## 2016-08-12 ENCOUNTER — Emergency Department
Admission: EM | Admit: 2016-08-12 | Discharge: 2016-09-10 | Disposition: E | Payer: Medicare Other | Attending: Emergency Medicine | Admitting: Emergency Medicine

## 2016-08-12 ENCOUNTER — Other Ambulatory Visit: Payer: Self-pay

## 2016-08-12 ENCOUNTER — Encounter: Payer: Self-pay | Admitting: Emergency Medicine

## 2016-08-12 ENCOUNTER — Encounter: Admission: EM | Disposition: E | Payer: Self-pay | Source: Home / Self Care | Attending: Emergency Medicine

## 2016-08-12 DIAGNOSIS — Z87891 Personal history of nicotine dependence: Secondary | ICD-10-CM | POA: Diagnosis not present

## 2016-08-12 DIAGNOSIS — I723 Aneurysm of iliac artery: Secondary | ICD-10-CM | POA: Insufficient documentation

## 2016-08-12 DIAGNOSIS — Z7901 Long term (current) use of anticoagulants: Secondary | ICD-10-CM | POA: Diagnosis not present

## 2016-08-12 DIAGNOSIS — I2109 ST elevation (STEMI) myocardial infarction involving other coronary artery of anterior wall: Secondary | ICD-10-CM | POA: Diagnosis not present

## 2016-08-12 DIAGNOSIS — I509 Heart failure, unspecified: Secondary | ICD-10-CM | POA: Diagnosis not present

## 2016-08-12 DIAGNOSIS — Z79899 Other long term (current) drug therapy: Secondary | ICD-10-CM | POA: Diagnosis not present

## 2016-08-12 DIAGNOSIS — J9690 Respiratory failure, unspecified, unspecified whether with hypoxia or hypercapnia: Secondary | ICD-10-CM | POA: Diagnosis not present

## 2016-08-12 DIAGNOSIS — I213 ST elevation (STEMI) myocardial infarction of unspecified site: Secondary | ICD-10-CM

## 2016-08-12 DIAGNOSIS — I110 Hypertensive heart disease with heart failure: Secondary | ICD-10-CM | POA: Insufficient documentation

## 2016-08-12 DIAGNOSIS — I4901 Ventricular fibrillation: Secondary | ICD-10-CM | POA: Diagnosis not present

## 2016-08-12 DIAGNOSIS — I160 Hypertensive urgency: Secondary | ICD-10-CM | POA: Diagnosis not present

## 2016-08-12 DIAGNOSIS — E785 Hyperlipidemia, unspecified: Secondary | ICD-10-CM | POA: Insufficient documentation

## 2016-08-12 DIAGNOSIS — I739 Peripheral vascular disease, unspecified: Secondary | ICD-10-CM | POA: Insufficient documentation

## 2016-08-12 DIAGNOSIS — I2510 Atherosclerotic heart disease of native coronary artery without angina pectoris: Secondary | ICD-10-CM | POA: Insufficient documentation

## 2016-08-12 DIAGNOSIS — I959 Hypotension, unspecified: Secondary | ICD-10-CM | POA: Diagnosis not present

## 2016-08-12 DIAGNOSIS — I4891 Unspecified atrial fibrillation: Secondary | ICD-10-CM | POA: Insufficient documentation

## 2016-08-12 DIAGNOSIS — R079 Chest pain, unspecified: Secondary | ICD-10-CM | POA: Diagnosis present

## 2016-08-12 DIAGNOSIS — I200 Unstable angina: Secondary | ICD-10-CM

## 2016-08-12 HISTORY — PX: CARDIAC CATHETERIZATION: SHX172

## 2016-08-12 LAB — COMPREHENSIVE METABOLIC PANEL
ALBUMIN: 3.4 g/dL — AB (ref 3.5–5.0)
ALT: 15 U/L (ref 14–54)
ANION GAP: 7 (ref 5–15)
AST: 22 U/L (ref 15–41)
Alkaline Phosphatase: 50 U/L (ref 38–126)
BILIRUBIN TOTAL: 0.6 mg/dL (ref 0.3–1.2)
BUN: 31 mg/dL — ABNORMAL HIGH (ref 6–20)
CO2: 25 mmol/L (ref 22–32)
Calcium: 9.4 mg/dL (ref 8.9–10.3)
Chloride: 101 mmol/L (ref 101–111)
Creatinine, Ser: 1.56 mg/dL — ABNORMAL HIGH (ref 0.44–1.00)
GFR calc non Af Amer: 30 mL/min — ABNORMAL LOW (ref >60)
GFR, EST AFRICAN AMERICAN: 34 mL/min — AB (ref >60)
GLUCOSE: 226 mg/dL — AB (ref 65–99)
POTASSIUM: 4.5 mmol/L (ref 3.5–5.1)
SODIUM: 133 mmol/L — AB (ref 135–145)
TOTAL PROTEIN: 7.1 g/dL (ref 6.5–8.1)

## 2016-08-12 LAB — DIFFERENTIAL
BASOS PCT: 0 %
Basophils Absolute: 0 10*3/uL (ref 0–0.1)
EOS PCT: 1 %
Eosinophils Absolute: 0.1 10*3/uL (ref 0–0.7)
LYMPHS PCT: 18 %
Lymphs Abs: 1.8 10*3/uL (ref 1.0–3.6)
Monocytes Absolute: 0.9 10*3/uL (ref 0.2–0.9)
Monocytes Relative: 9 %
NEUTROS ABS: 7.3 10*3/uL — AB (ref 1.4–6.5)
NEUTROS PCT: 72 %

## 2016-08-12 LAB — PROTIME-INR
INR: 1.09
PROTHROMBIN TIME: 14.1 s (ref 11.4–15.2)

## 2016-08-12 LAB — LIPID PANEL
CHOLESTEROL: 198 mg/dL (ref 0–200)
HDL: 36 mg/dL — AB (ref >40)
LDL CALC: 131 mg/dL — AB (ref 0–99)
TRIGLYCERIDES: 154 mg/dL — AB (ref <150)
Total CHOL/HDL Ratio: 5.5 RATIO
VLDL: 31 mg/dL (ref 0–40)

## 2016-08-12 LAB — CBC
HCT: 26.2 % — ABNORMAL LOW (ref 35.0–47.0)
Hemoglobin: 8.5 g/dL — ABNORMAL LOW (ref 12.0–16.0)
MCH: 25.4 pg — ABNORMAL LOW (ref 26.0–34.0)
MCHC: 32.3 g/dL (ref 32.0–36.0)
MCV: 78.7 fL — ABNORMAL LOW (ref 80.0–100.0)
PLATELETS: 387 10*3/uL (ref 150–440)
RBC: 3.33 MIL/uL — AB (ref 3.80–5.20)
RDW: 20.4 % — ABNORMAL HIGH (ref 11.5–14.5)
WBC: 10.2 10*3/uL (ref 3.6–11.0)

## 2016-08-12 LAB — APTT: aPTT: 28 seconds (ref 24–36)

## 2016-08-12 LAB — BRAIN NATRIURETIC PEPTIDE: B Natriuretic Peptide: 821 pg/mL — ABNORMAL HIGH (ref 0.0–100.0)

## 2016-08-12 LAB — TROPONIN I: Troponin I: 0.05 ng/mL (ref <0.03)

## 2016-08-12 SURGERY — LEFT HEART CATH AND CORONARY ANGIOGRAPHY
Anesthesia: Moderate Sedation

## 2016-08-12 MED ORDER — LIDOCAINE HCL (CARDIAC) 20 MG/ML IV SOLN
INTRAVENOUS | Status: DC | PRN
  Administered 2016-08-12: 100 mg via INTRAVENOUS

## 2016-08-12 MED ORDER — HEPARIN SODIUM (PORCINE) 1000 UNIT/ML IJ SOLN
INTRAMUSCULAR | Status: DC | PRN
  Administered 2016-08-12: 3000 [IU] via INTRAVENOUS

## 2016-08-12 MED ORDER — IOPAMIDOL (ISOVUE-300) INJECTION 61%
INTRAVENOUS | Status: DC | PRN
  Administered 2016-08-12: 100 mL via INTRA_ARTERIAL

## 2016-08-12 MED ORDER — ATROPINE SULFATE 1 MG/10ML IJ SOSY
PREFILLED_SYRINGE | INTRAMUSCULAR | Status: DC | PRN
  Administered 2016-08-12 (×3): 1 mg via INTRAVENOUS

## 2016-08-12 MED ORDER — NOREPINEPHRINE BITARTRATE 1 MG/ML IV SOLN
INTRAVENOUS | Status: DC | PRN
  Administered 2016-08-12: 50 ug/min via INTRAVENOUS

## 2016-08-12 MED ORDER — NOREPINEPHRINE BITARTRATE 1 MG/ML IV SOLN
INTRAVENOUS | Status: AC
  Filled 2016-08-12: qty 4

## 2016-08-12 MED ORDER — VASOPRESSIN 20 UNIT/ML IV SOLN
INTRAVENOUS | Status: DC | PRN
  Administered 2016-08-12: 20 [IU] via INTRAVENOUS

## 2016-08-12 MED ORDER — AMIODARONE HCL 150 MG/3ML IV SOLN
INTRAVENOUS | Status: DC | PRN
  Administered 2016-08-12: 150 mg via INTRAVENOUS

## 2016-08-12 MED ORDER — SODIUM CHLORIDE 0.9 % IV SOLN
10.0000 mL/h | INTRAVENOUS | Status: DC
  Administered 2016-08-12: 20 mL/h via INTRAVENOUS

## 2016-08-12 MED ORDER — AMIODARONE HCL 150 MG/3ML IV SOLN
INTRAVENOUS | Status: AC
  Filled 2016-08-12: qty 3

## 2016-08-12 MED ORDER — NITROGLYCERIN IN D5W 200-5 MCG/ML-% IV SOLN
10.0000 ug/min | INTRAVENOUS | Status: DC
  Administered 2016-08-12: 40 ug/min via INTRAVENOUS

## 2016-08-12 MED ORDER — EPINEPHRINE PF 1 MG/10ML IJ SOSY
PREFILLED_SYRINGE | INTRAMUSCULAR | Status: DC | PRN
  Administered 2016-08-12 (×6): 1 via INTRAVENOUS

## 2016-08-12 MED ORDER — ASPIRIN 81 MG PO CHEW
324.0000 mg | CHEWABLE_TABLET | Freq: Once | ORAL | Status: AC
  Administered 2016-08-12: 324 mg via ORAL

## 2016-08-12 MED ORDER — DOPAMINE-DEXTROSE 3.2-5 MG/ML-% IV SOLN
INTRAVENOUS | Status: DC | PRN
  Administered 2016-08-12: 20 ug/kg/min via INTRAVENOUS

## 2016-08-12 MED ORDER — HEPARIN SODIUM (PORCINE) 1000 UNIT/ML IJ SOLN
INTRAMUSCULAR | Status: AC
  Filled 2016-08-12: qty 1

## 2016-08-12 SURGICAL SUPPLY — 14 items
CATH IAB 7FR 34ML (CATHETERS) ×2 IMPLANT
CATH INFINITI 5FR ANG PIGTAIL (CATHETERS) ×2 IMPLANT
CATH INFINITI 5FR JL4 (CATHETERS) ×4 IMPLANT
CATH INFINITI JR4 5F (CATHETERS) ×2 IMPLANT
CATH VISTA GUIDE 6FR JL4 SH (CATHETERS) ×2 IMPLANT
DEVICE INFLAT 30 PLUS (MISCELLANEOUS) ×2 IMPLANT
KIT MANI 3VAL PERCEP (MISCELLANEOUS) ×3 IMPLANT
KIT POST MORTEM ADULT 36X90 (BAG) ×2 IMPLANT
KIT TRANSPAC II SGL 4260605 (MISCELLANEOUS) ×2 IMPLANT
NDL PERC 18GX7CM (NEEDLE) IMPLANT
NEEDLE PERC 18GX7CM (NEEDLE) ×3 IMPLANT
PACK CARDIAC CATH (CUSTOM PROCEDURE TRAY) ×3 IMPLANT
SHEATH AVANTI 6FR X 11CM (SHEATH) ×2 IMPLANT
WIRE EMERALD 3MM-J .035X150CM (WIRE) ×2 IMPLANT

## 2016-08-12 NOTE — Consult Note (Signed)
Reason for Consult: STEMI anterior wall/heart failure Referring Physician: Emergency  Dr Evelene Croon is an 83 y.o. female.  HPI: Patient's 83 year old female presents with acute onset of chest discomfort about an hour prior to presentation with dyspnea shortness of breath and heart failure she was evaluated emergency room placed on BiPAP to help with heart failure she had elevated blood pressures while has a history of atrial fibrillation on Eliquis. Patient appeared to be in acute heart failure secondary to her STEMI presentation with hypertensive urgency systolic blood pressures of 200. Patient was brought to the catheter lab for acute angiogram with intention to treat with PCI and stent if amenable. Patient has significant improvement in her chest pain symptoms upon presentation from a 10 down to 2 EKG remained abnormal with diffuse ST elevation. Patient presented at around 2000 to emergency room. STEMI was called at about 2010.  Past Medical History:  Diagnosis Date  . Atrial fibrillation ([place])   . CHF (congestive heart failure) ([place])   . Hyperlipidemia   . Hypertension     Past Surgical History:  Procedure Laterality Date  . TONSILLECTOMY      Family History  Problem Relation Age of Onset  . CAD Mother   . Stroke Father   . Breast cancer Sister     Social History:  reports that she quit smoking about 39 years ago. She has never used smokeless tobacco. She reports that she does not drink alcohol or use drugs.  Allergies:  Allergies  Allergen Reactions  . Diltiazem Other (See Comments)    "gave me heart failure"  . Amlodipine Other (See Comments)    Dizziness   . Codeine Nausea Only  . Hydralazine Other (See Comments)    Shaking all over   . Aldomet [Methyldopa] Rash    Medications: I have reviewed the patient's current medications.  Results for orders placed or performed during the hospital encounter of 07/27/2016 (from the past 48 hour(s))  CBC      Status: Abnormal   Collection Time: 08/01/2016  8:25 PM  Result Value Ref Range   WBC 10.2 3.6 - 11.0 K/uL   RBC 3.33 (L) 3.80 - 5.20 MIL/uL   Hemoglobin 8.5 (L) 12.0 - 16.0 g/dL   HCT 26.2 (L) 35.0 - 47.0 %   MCV 78.7 (L) 80.0 - 100.0 fL   MCH 25.4 (L) 26.0 - 34.0 pg   MCHC 32.3 32.0 - 36.0 g/dL   RDW 20.4 (H) 11.5 - 14.5 %   Platelets 387 150 - 440 K/uL  Differential     Status: Abnormal   Collection Time: 07/23/2016  8:25 PM  Result Value Ref Range   Neutrophils Relative % 72 %   Neutro Abs 7.3 (H) 1.4 - 6.5 K/uL   Lymphocytes Relative 18 %   Lymphs Abs 1.8 1.0 - 3.6 K/uL   Monocytes Relative 9 %   Monocytes Absolute 0.9 0.2 - 0.9 K/uL   Eosinophils Relative 1 %   Eosinophils Absolute 0.1 0 - 0.7 K/uL   Basophils Relative 0 %   Basophils Absolute 0.0 0 - 0.1 K/uL  Protime-INR     Status: None   Collection Time: 07/24/2016  8:25 PM  Result Value Ref Range   Prothrombin Time 14.1 11.4 - 15.2 seconds   INR 1.09   APTT     Status: None   Collection Time: 07/25/2016  8:25 PM  Result Value Ref Range   aPTT 28 24 -  36 seconds  Comprehensive metabolic panel     Status: Abnormal   Collection Time: 08/08/2016  8:25 PM  Result Value Ref Range   Sodium 133 (L) 135 - 145 mmol/L   Potassium 4.5 3.5 - 5.1 mmol/L   Chloride 101 101 - 111 mmol/L   CO2 25 22 - 32 mmol/L   Glucose, Bld 226 (H) 65 - 99 mg/dL   BUN 31 (H) 6 - 20 mg/dL   Creatinine, Ser 1.56 (H) 0.44 - 1.00 mg/dL   Calcium 9.4 8.9 - 10.3 mg/dL   Total Protein 7.1 6.5 - 8.1 g/dL   Albumin 3.4 (L) 3.5 - 5.0 g/dL   AST 22 15 - 41 U/L   ALT 15 14 - 54 U/L   Alkaline Phosphatase 50 38 - 126 U/L   Total Bilirubin 0.6 0.3 - 1.2 mg/dL   GFR calc non Af Amer 30 (L) >60 mL/min   GFR calc Af Amer 34 (L) >60 mL/min    Comment: (NOTE) The eGFR has been calculated using the CKD EPI equation. This calculation has not been validated in all clinical situations. eGFR's persistently <60 mL/min signify possible Chronic Kidney Disease.     Anion gap 7 5 - 15  Troponin I     Status: Abnormal   Collection Time: 08/07/2016  8:25 PM  Result Value Ref Range   Troponin I 0.05 (HH) <0.03 ng/mL    Comment: CRITICAL RESULT CALLED TO, READ BACK BY AND VERIFIED WITH KENZIE CAMPBELL AT 2102 07/13/2016 BY [facility].   Lipid panel     Status: Abnormal   Collection Time: 08/07/2016  8:25 PM  Result Value Ref Range   Cholesterol 198 0 - 200 mg/dL   Triglycerides 154 (H) <150 mg/dL   HDL 36 (L) >40 mg/dL   Total CHOL/HDL Ratio 5.5 RATIO   VLDL 31 0 - 40 mg/dL   LDL Cholesterol 131 (H) 0 - 99 mg/dL    Comment:        Total Cholesterol/HDL:CHD Risk Coronary Heart Disease Risk Table                     Men   Women  1/2 Average Risk   3.4   3.3  Average Risk       5.0   4.4  2 X Average Risk   9.6   7.1  3 X Average Risk  23.4   11.0        Use the calculated Patient Ratio above and the CHD Risk Table to determine the patient's CHD Risk.        ATP III CLASSIFICATION (LDL):  <100     mg/dL   Optimal  100-129  mg/dL   Near or Above                    Optimal  130-159  mg/dL   Borderline  160-189  mg/dL   High  >190     mg/dL   Very High   Brain natriuretic peptide     Status: Abnormal   Collection Time: 07/28/2016  8:25 PM  Result Value Ref Range   B Natriuretic Peptide 821.0 (H) 0.0 - 100.0 pg/mL    No results found.  ROS Blood pressure (!) 178/94, pulse 89, temperature 97.4 F (36.3 C), temperature source Axillary, resp. rate (!) 30, height '5\' 6"'$  (1.676 m), weight 60.8 kg (134 lb), SpO2 90 %. Physical Exam  Assessment/Plan: STEMI anterior wall  Atrial fibrillation on anticoagulation Acute congestive heart failure Dyspnea Respiratory failure Hypertensive urgency . PLAN Patient was brought to the catheter lab with intention to treat for STEMI Initial diagnostic studies suggested a 99% left main The patient had thrombus in the proximal LAD but relatively decent distal vessels of the left system RCA had moderate to severe  coronary disease Left ventriculogram was not performed Surgical [facility] at [facility] Dr. Pierre Bali was notified about a potential transfer Patient was intubated because of her acute condition by the emergency room physician CODE BLUE was called because the patient developed hypotension and ventricular fibrillation repeatedly and recurrently without stabilization We were unable to maintain a stable pulse and blood pressure Patient receive antiarrhythmics amiodarone as well as lidocaine Pressors were Levophed as well as dopamine Patient suffered ventricular fibrillation during the procedure because of persistent ongoing unrelenting ischemia Patient developed hypotension unresponsive to pressors Balloon pump was placed without significant improvement in his condition Intervention was not attempted because she had ostial left main which did not make intervention of viable option Patient subsequently was not able to survive her acute event and patient was pronounced at 2153/10/14 Family was notified and the chaplin was present  [place] 07/16/2016, 10:11 PM

## 2016-08-12 NOTE — ED Provider Notes (Signed)
[facility] Emergency Department Provider Note  ____________________________________________   First MD Initiated Contact with Patient 08/10/2016 2026     (approximate)  I have reviewed the triage vital signs and the nursing notes.   HISTORY  Chief Complaint Code STEMI   HPI Kristin Ayers is a 83 y.o. female with a history of CHF and atrial fibrillation on eliquis was presenting to the emergency department tonight with midsternal, sudden onset chest pain that started 1 hour prior to arrival. En route, the medics obtained an EKG which met STEMI criteria for an anterior septal infarct. They notified carelink from the field. Upon arrival to the emergency department the patient was on CPAP. She had an oxygen saturation of 100% originally on room air was 77%. She was also hypertensive in the field. At the time of arrival the patient says that her breathing has improved and her chest pain is no longer there.   Past Medical History:  Diagnosis Date  . Atrial fibrillation ([place])   . CHF (congestive heart failure) ([place])   . Hyperlipidemia   . Hypertension     Patient Active Problem List   Diagnosis Date Noted  . CHF (congestive heart failure) ([place]) 06/20/2016  . CHF exacerbation ([place]) 06/13/2016    Past Surgical History:  Procedure Laterality Date  . TONSILLECTOMY      Prior to Admission medications   Medication Sig Start Date End Date Taking? Authorizing Provider  apixaban (ELIQUIS) 2.5 MG TABS tablet Take 2.5 mg by mouth 2 (two) times daily.   Yes Historical Provider, MD  carvedilol (COREG) 6.25 MG tablet Take 1 tablet (6.25 mg total) by mouth 2 (two) times daily with a meal. Patient taking differently: Take 3.125 mg by mouth 2 (two) times daily with a meal.  06/22/16  Yes Vaughan Basta, MD  spironolactone (ALDACTONE) 25 MG tablet Take 1 tablet (25 mg total) by mouth daily. 06/22/16  Yes Vaughan Basta, MD  valsartan (DIOVAN) 160 MG tablet  Take 160 mg by mouth daily.   Yes Historical Provider, MD  amiodarone (PACERONE) 200 MG tablet Take 200 mg by mouth daily.    Historical Provider, MD  amoxicillin-clavulanate (AUGMENTIN) 875-125 MG tablet Take 1 tablet by mouth 2 (two) times daily. 06/22/16   Vaughan Basta, MD  aspirin EC 81 MG EC tablet Take 1 tablet (81 mg total) by mouth daily. Patient not taking: Reported on 08/10/2016 06/23/16   Vaughan Basta, MD  cholecalciferol (VITAMIN D) 1000 units tablet Take 1,000 Units by mouth daily.    Historical Provider, MD  [facility] Liver Oil 1000 MG CAPS Take 2,000 mg by mouth daily.    Historical Provider, MD  Coenzyme Q10 (CO Q-10) 200 MG CAPS Take 1 capsule by mouth daily.    Historical Provider, MD  furosemide (LASIX) 20 MG tablet Take 1 tablet (20 mg total) by mouth every other day. 06/13/16   Fritzi Mandes, MD  potassium chloride SA (K-DUR,KLOR-CON) 20 MEQ tablet Take 20 mEq by mouth 2 (two) times daily.    Historical Provider, MD  vitamin B-12 (CYANOCOBALAMIN) 1000 MCG tablet Take 1,000 mcg by mouth daily.    Historical Provider, MD    Allergies Diltiazem; Amlodipine; Codeine; Hydralazine; and Aldomet [methyldopa]  Family History  Problem Relation Age of Onset  . CAD Mother   . Stroke Father   . Breast cancer Sister     Social History Social History  Substance Use Topics  . Smoking status: Former Smoker  Quit date: 5  . Smokeless tobacco: Never Used  . Alcohol use No    Review of Systems Constitutional: No fever/chills Eyes: No visual changes. ENT: No sore throat. Cardiovascular: As above. Respiratory: Shortness of breath associated with the chest pain. Gastrointestinal: No abdominal pain.  No nausea, no vomiting.  No diarrhea.  No constipation. Genitourinary: Negative for dysuria. Musculoskeletal: Negative for back pain. Skin: Negative for rash. Neurological: Negative for headaches, focal weakness or numbness.  10-point ROS otherwise  negative.  ____________________________________________   PHYSICAL EXAM:  VITAL SIGNS: ED Triage Vitals  Enc Vitals Group     BP 08/11/2016 2026 (!) 226/106     Pulse Rate 07/25/2016 2026 92     Resp 07/19/2016 2026 (!) 30     Temp 08/02/2016 2026 97.4 F (36.3 C)     Temp Source 08/05/2016 2026 Axillary     SpO2 07/17/2016 2026 100 %     Weight 08/10/2016 2027 134 lb (60.8 kg)     Height 08/10/2016 2027 '5\' 6"'$  (1.676 m)     Head Circumference --      Peak Flow --      Pain Score --      Pain Loc --      Pain Edu? --      Excl. in [place]? --     Constitutional: Alert and oriented. No acute distress. Eyes: Conjunctivae are normal. PERRL. EOMI. Head: Atraumatic. Nose: No congestion/rhinnorhea. Mouth/Throat: Mucous membranes are moist. Tolerating the BiPAP well. Neck: No stridor.   Cardiovascular: Normal rate, regular rhythm. Grossly normal heart sounds.  Good peripheral circulation with intact, equal and bilateral radial as well as dorsalis pedis pulses. Respiratory:  Increased work of breathing with rales throughout. Gastrointestinal: Soft and nontender. No distention.  Musculoskeletal: No lower extremity tenderness nor edema.  No joint effusions. Neurologic:  Normal speech and language. No gross focal neurologic deficits are appreciated.  Skin:  Skin is warm, dry and intact. No rash noted. Psychiatric: Mood and affect are normal. Speech and behavior are normal.  ____________________________________________   LABS (all labs ordered are listed, but only abnormal results are displayed)  Labs Reviewed  CBC - Abnormal; Notable for the following:       Result Value   RBC 3.33 (*)    Hemoglobin 8.5 (*)    HCT 26.2 (*)    MCV 78.7 (*)    MCH 25.4 (*)    RDW 20.4 (*)    All other components within normal limits  DIFFERENTIAL - Abnormal; Notable for the following:    Neutro Abs 7.3 (*)    All other components within normal limits  PROTIME-INR  APTT  COMPREHENSIVE METABOLIC PANEL   TROPONIN I  LIPID PANEL  BRAIN NATRIURETIC PEPTIDE   ____________________________________________  EKG  ED ECG REPORT I, Doran Stabler, the attending physician, personally viewed and interpreted this ECG.   Date: 07/30/2016  EKG Time: 2002  Rate: 90  Rhythm: normal sinus rhythm  Axis: Normal  Intervals:none  ST&T Change: ST elevations of 3 mm to 4 mm in V1 through V3 with reciprocal depressions in V5 and V6 as well as 2 and 3.  ED ECG REPORT I, Doran Stabler, the attending physician, personally viewed and interpreted this ECG.   Date: 07/25/2016  EKG Time: 2022  Rate: 94  Rhythm: normal sinus rhythm  Axis: Normal  Intervals:none  ST&T Change: Persistent elevations in V2 of 3 mm with hyperacute T waves in V3  and tombstone appearance of ST segment as well and V3. Her system reciprocal depressions in V5 and V6 as well as a deeply inverted T wave in lead 3.   ____________________________________________  RADIOLOGY  Pending ____________________________________________   PROCEDURES  Procedure(s) performed:   Procedures  Critical Care performed: Yes, see critical care note(s) CRITICAL CARE Performed by: Doran Stabler   Total critical care time: 35 minutes  Critical care time was exclusive of separately billable procedures and treating other patients.  Critical care was necessary to treat or prevent imminent or life-threatening deterioration.  Critical care was time spent personally by me on the following activities: development of treatment plan with patient and/or surrogate as well as nursing, discussions with consultants, evaluation of patient's response to treatment, examination of patient, obtaining history from patient or surrogate, ordering and performing treatments and interventions, ordering and review of laboratory studies, ordering and review of radiographic studies, pulse oximetry and re-evaluation of patient's  condition.  ____________________________________________   INITIAL IMPRESSION / ASSESSMENT AND PLAN / ED COURSE  Pertinent labs & imaging results that were available during my care of the patient were reviewed by me and considered in my medical decision making (see chart for details).  ----------------------------------------- 8:55 PM on 08/05/2016 -----------------------------------------  At this time the patient has been escorted to the Cath Lab with ER nursing as well as Dr. Clayborn Bigness of cardiology. The patient was started on nitroglycerin drip at 40 and is gradually being weaned down as her pressure came down to the 170s over 90s. She remains pain-free. She is continued on BiPAP at this time. Discussed anticoagulation with Dr. Clayborn Bigness and he would not like any further anticoagulation beyond the aspirin that she was already given as the patient is on eliquis.      ____________________________________________   FINAL CLINICAL IMPRESSION(S) / ED DIAGNOSES  STEMI.    NEW MEDICATIONS STARTED DURING THIS VISIT:  New Prescriptions   No medications on file     Note:  This document was prepared using Dragon voice recognition software and may include unintentional dictation errors.    Orbie Pyo, MD 07/24/2016 [phone]

## 2016-08-12 NOTE — ED Notes (Signed)
Dr. Juliann Paresallwood to bedside at this time.

## 2016-08-12 NOTE — ED Provider Notes (Signed)
[facility]  Department of Emergency Medicine   Code Blue CONSULT NOTE  Chief Complaint: Cardiac arrest/unresponsive   Level V Caveat: Unresponsive  History of present illness: I was contacted by the hospital for a CODE BLUE cardiac arrest in the cath lab. Patient was recently taken from the ER to the cath lab for STEMI. Procedure had been started. Pulses were lost. CPR was in progress when I arrived.   ROS: Unable to obtain, Level V caveat  Scheduled Meds: Continuous Infusions: . sodium chloride 20 mL/hr (08/03/2016 2031)  . [MAR Hold] nitroGLYCERIN 15 mcg/min (08/02/2016 2042)   PRN Meds:. Past Medical History:  Diagnosis Date  . Atrial fibrillation (H[facility])   . CHF (congestive heart failure) (H[facility])   . Hyperlipidemia   . Hypertension    Past Surgical History:  Procedure Laterality Date  . TONSILLECTOMY     Social History   Social History  . Marital status: Widowed    Spouse name: N/A  . Number of children: N/A  . Years of education: N/A   Occupational History  . retired    Social History Main Topics  . Smoking status: Former Smoker    Quit date: 1979  . Smokeless tobacco: Never Used  . Alcohol use No  . Drug use: No  . Sexual activity: Not on file   Other Topics Concern  . Not on file   Social History Narrative  . No narrative on file   Allergies  Allergen Reactions  . Diltiazem Other (See Comments)    "gave me heart failure"  . Amlodipine Other (See Comments)    Dizziness   . Codeine Nausea Only  . Hydralazine Other (See Comments)    Shaking all over   . Aldomet [Methyldopa] Rash    Last set of Vital Signs (not current) Vitals:   07/28/2016 2026 07/30/2016 2038  BP: (!) 226/106 (!) 178/94  Pulse: 92 89  Resp: (!) 30   Temp: 97.4 F (36.3 C)       Physical Exam  Gen: unresponsive Cardiovascular: pulseless  Resp: agonal respirations, being bagged by respiratory therapy. Neuro: GCS 3, unresponsive to pain   Procedures   INTUBATION Performed by: Phineas SemenGOODMAN, Rachelanne Whidby Required items: required blood products, implants, devices, and special equipment available Patient identity confirmed: provided demographic data and hospital-assigned identification number Time out: Immediately prior to procedure a "time out" was called to verify the correct patient, procedure, equipment, support staff and site/side marked as required. Indications: CPR Intubation method: glidescope Sedatives: etomidate Paralytic: succinylcholine  Post-procedure assessment: chest rise and ETCO2 monitor Breath sounds: equal and absent over the epigastrium Tube secured by Respiratory Therapy Patient tolerated the procedure well with no immediate complications.  CRITICAL CARE Performed by: Phineas SemenGOODMAN, Ladonya Jerkins Total critical care time: 10 Critical care time was exclusive of separately billable procedures and treating other patients. Critical care was necessary to treat or prevent imminent or life-threatening deterioration. Critical care was time spent personally by me on the following activities: development of treatment plan with patient and/or surrogate as well as nursing, discussions with consultants, evaluation of patient's response to treatment, examination of patient, obtaining history from patient or surrogate, ordering and performing treatments and interventions, ordering and review of laboratory studies, ordering and review of radiographic studies, pulse oximetry and re-evaluation of patient's condition.   Medical Decision making  Arrived to patient's bed side with CPR ongoing. In consultation with Dr. Juliann Paresallwood with cardiology decision was made to intubate. Breath sounds and CO2 monitor  appropriate after intubation. Pulses were regained.    Phineas Semen, MD 08/19/2016 [phone]

## 2016-08-12 NOTE — Progress Notes (Signed)
2147 was the time the Balloon pump was started

## 2016-08-12 NOTE — ED Notes (Signed)
Cath lab notified this RN that they are ready for pt; waiting on cardiologist at this time.

## 2016-08-12 NOTE — ED Triage Notes (Signed)
[facility] with [facility] chest pain x 1 hour ago.  EMS reports BP 222/112, 77% on room air; pt placed on NRB with sat 80%, pt placed on Cpap en route, sats in 90's at that time.  Code Stemi called per MD upon arrival.  MD at bedside.

## 2016-08-13 ENCOUNTER — Encounter: Payer: Self-pay | Admitting: Internal Medicine

## 2016-08-13 DEATH — deceased

## 2016-09-10 DEATH — deceased

## 2018-03-14 IMAGING — DX DG CHEST 1V PORT
1 series · 1 of 1 positions shown · non-contrast
Comparison: None.

CLINICAL DATA: Acute onset of shortness of breath. High blood
pressure and decreased O2 saturation. Initial encounter.

EXAM:
PORTABLE CHEST 1 VIEW

[chest ap]
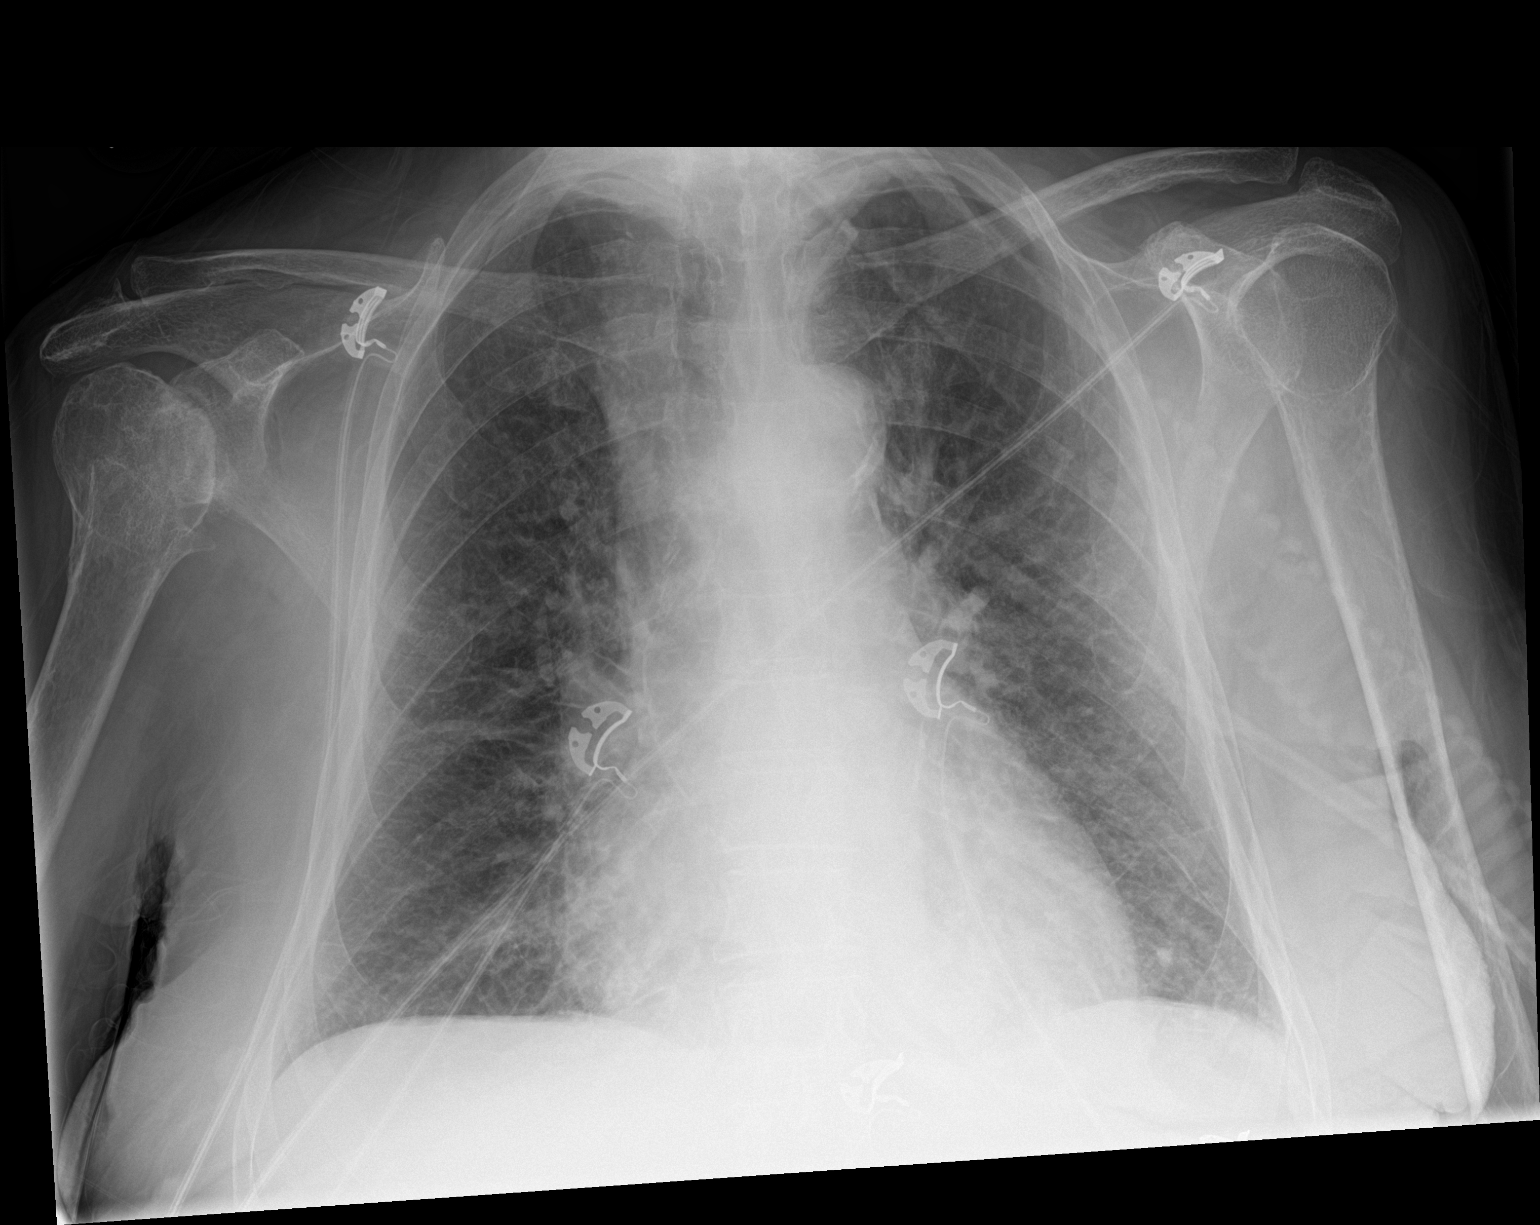

[1 of 1 positions shown; findings below may reference images not displayed]

FINDINGS: The lungs are well-aerated. Vascular congestion is noted. Increased
interstitial markings raise concern for mild interstitial edema.
There is no evidence of pleural effusion or pneumothorax.

The cardiomediastinal silhouette is mildly enlarged. No acute
osseous abnormalities are seen. Degenerative change is noted at the
right glenohumeral joint, with bony remodeling.
IMPRESSION: Vascular congestion and mild cardiomegaly. Increased interstitial
markings raise concern for mild interstitial edema.

## 2018-03-22 IMAGING — CR DG CHEST 2V
2 series · 2 of 2 positions shown · non-contrast
Comparison: June 20, 2016

CLINICAL DATA: Shortness of breath.

EXAM:
CHEST  2 VIEW

[chest pa]
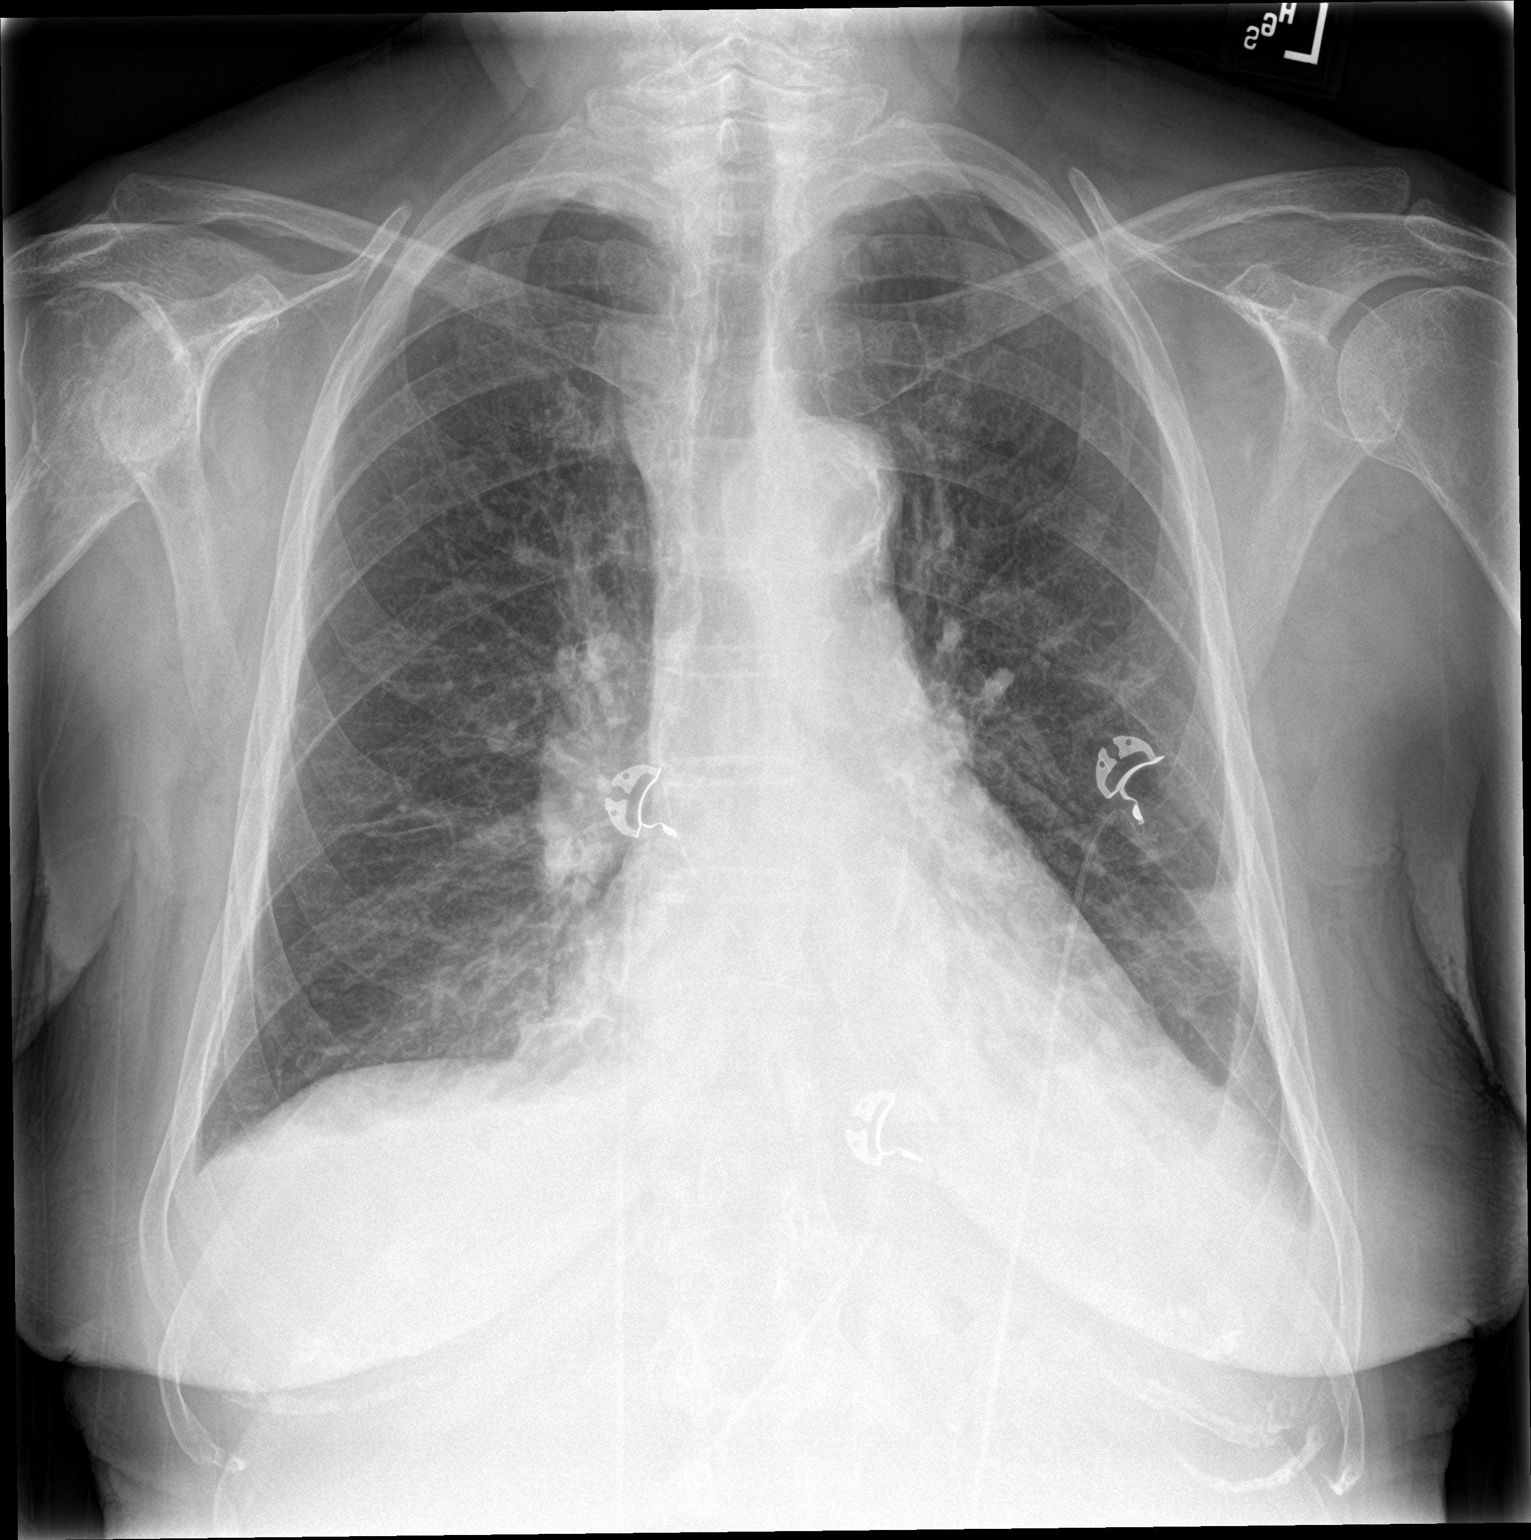

[chest lat]
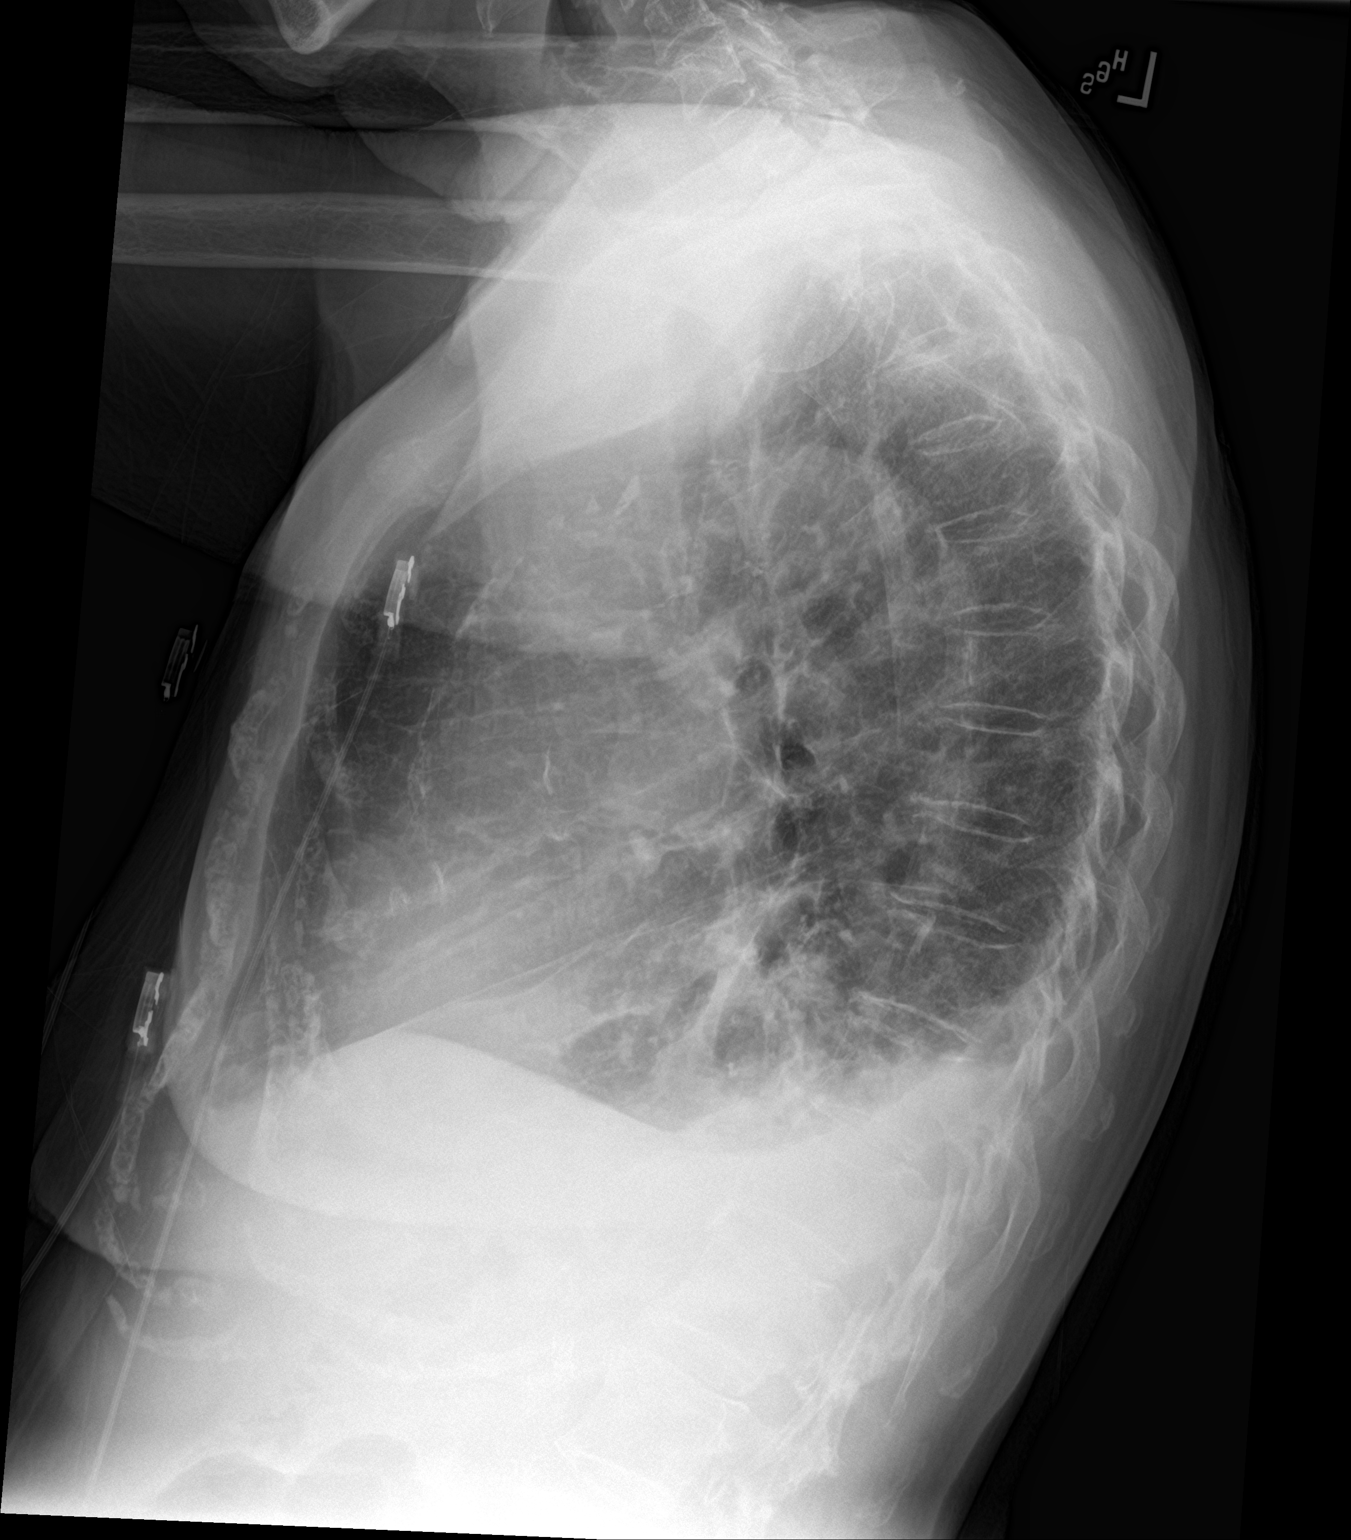

[2 of 2 positions shown; findings below may reference images not displayed]

FINDINGS: Bilateral pleural effusions, left greater than right, similar in the
interval. Mild increased opacity in the lateral left lung base is
nonspecific. No other changes.
IMPRESSION: Small bilateral effusions with underlying atelectasis. Slight
increased opacity in the left lateral lung base is nonspecific.
Recommend attention on follow-up.
